# Patient Record
Sex: Male | Born: 1970 | Race: White | Hispanic: No | Smoking: Former smoker
Health system: Southern US, Community
[De-identification: ages and names within clinical notes are randomized; demographics above are authoritative.]

## PROBLEM LIST (undated history)

## (undated) DIAGNOSIS — H35039 Hypertensive retinopathy, unspecified eye: Secondary | ICD-10-CM

## (undated) DIAGNOSIS — F419 Anxiety disorder, unspecified: Secondary | ICD-10-CM

## (undated) DIAGNOSIS — K219 Gastro-esophageal reflux disease without esophagitis: Secondary | ICD-10-CM

## (undated) DIAGNOSIS — M1990 Unspecified osteoarthritis, unspecified site: Secondary | ICD-10-CM

## (undated) DIAGNOSIS — H269 Unspecified cataract: Secondary | ICD-10-CM

## (undated) DIAGNOSIS — F909 Attention-deficit hyperactivity disorder, unspecified type: Secondary | ICD-10-CM

## (undated) DIAGNOSIS — J45909 Unspecified asthma, uncomplicated: Secondary | ICD-10-CM

## (undated) HISTORY — DX: Unspecified cataract: H26.9

## (undated) HISTORY — DX: Attention-deficit hyperactivity disorder, unspecified type: F90.9

## (undated) HISTORY — PX: TOOTH EXTRACTION: SUR596

## (undated) HISTORY — DX: Gastro-esophageal reflux disease without esophagitis: K21.9

## (undated) HISTORY — DX: Hypertensive retinopathy, unspecified eye: H35.039

## (undated) HISTORY — DX: Unspecified asthma, uncomplicated: J45.909

---

## 2009-02-16 ENCOUNTER — Encounter: Payer: Self-pay | Admitting: Cardiology

## 2009-08-03 ENCOUNTER — Encounter: Payer: Self-pay | Admitting: Cardiology

## 2009-08-10 ENCOUNTER — Encounter: Payer: Self-pay | Admitting: Cardiology

## 2009-08-14 ENCOUNTER — Emergency Department (HOSPITAL_COMMUNITY): Admission: EM | Admit: 2009-08-14 | Discharge: 2009-08-14 | Payer: Self-pay | Admitting: Emergency Medicine

## 2009-08-21 ENCOUNTER — Encounter: Payer: Self-pay | Admitting: Cardiology

## 2009-08-22 ENCOUNTER — Ambulatory Visit: Payer: Self-pay | Admitting: Cardiology

## 2009-08-22 DIAGNOSIS — E785 Hyperlipidemia, unspecified: Secondary | ICD-10-CM | POA: Insufficient documentation

## 2009-08-22 DIAGNOSIS — R079 Chest pain, unspecified: Secondary | ICD-10-CM | POA: Insufficient documentation

## 2009-08-24 ENCOUNTER — Ambulatory Visit: Payer: Self-pay | Admitting: Cardiology

## 2009-08-24 ENCOUNTER — Ambulatory Visit: Payer: Self-pay

## 2009-09-04 ENCOUNTER — Emergency Department (HOSPITAL_COMMUNITY): Admission: EM | Admit: 2009-09-04 | Discharge: 2009-09-04 | Payer: Self-pay | Admitting: Family Medicine

## 2010-07-23 NOTE — Consult Note (Signed)
Summary: [facility]   Imported By: Marylou Mccoy 09/07/2009 16:13:29  _____________________________________________________________________  External Attachment:    Type:   Image     Comment:   External Document

## 2010-07-23 NOTE — Assessment & Plan Note (Signed)
Summary: np6/pt has fam hx of MI and cabg/lg   Primary Provider:  Tomi Bamberger  CC:  new patient fam hx for MI and CABG.  Pt had normal ekg on Feb. 11 and 2011.   Marland Kitchen  History of Present Illness: 40 yo with family history of CAD and episodes of shortness of breath and chest pain presents for evaluation.  Patient's father had an MI at 86, which led the patient to want to be checked out.  He has been having episodes of shortness of breath with exertion and at rest sporadically for a couple of years.  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  He has never been formally given the diagnosis of asthma.  The shortness of breath also is associated with chest tightness that lasts 1-2 minutes at a time.  Patient specifically notes that he will get a tight chest and shortness of breath if he tries to climb 2 flights of steps at a fast pace.  He quit smoking in 2009.  He used to have GERD symptoms after meals but these were relieved with omeprazole.  Patient was recently given a prescription for Zocor for elevated lipids but he want to try to avoid medicines and to lower cholesterol with diet and exercise.    ECG: NSR, normal  Labs (2/11): LDL 140,. TGs 176, HDL 29, small LDL 704 (elevated)  Current Medications (verified): 1)  Omeprazole 20 Mg Cpdr (Omeprazole) .... Once Daily 2)  Fish Oil 1000 Mg Caps (Omega-3 Fatty Acids) .... Take One Tablet Once Daily  Allergies (verified): 1)  ! Vicodin  Past History:  Past Medical History: 1. GERD 2. ? asthma  Family History: Reviewed history from 08/21/2009 and no changes required. Father: MI at 59 w/ CABG.  No other coronary disease in the family.   Social History: Works as Psychologist, occupational, lives in [place], quit smoking in 2009, occasional ETOH, no drugs  Review of Systems       All systems reviewed and negative except as per HPI.   Vital Signs:  Patient profile:   40 year old male Height:       69 inches Weight:      199 pounds BMI:     29.49 Pulse rate:   81 / minute Pulse rhythm:   regular BP sitting:   142 / 70  (left arm) Cuff size:   large  Vitals Entered By: Judithe Modest CMA (August 22, 2009 9:48 AM)  Physical Exam  General:  Well developed, well nourished, in no acute distress. Head:  normocephalic and atraumatic Nose:  no deformity, discharge, inflammation, or lesions Mouth:  Teeth, gums and palate normal. Oral mucosa normal. Neck:  Neck supple, no JVD. No masses, thyromegaly or abnormal cervical nodes. Lungs:  Clear bilaterally to auscultation and percussion. Heart:  Non-displaced PMI, chest non-tender; regular rate and rhythm, S1, S2 without murmurs, rubs or gallops. Carotid upstroke normal, no bruit. Pedals normal pulses. No edema, no varicosities. Abdomen:  Bowel sounds positive; abdomen soft and non-tender without masses, organomegaly, or hernias noted. No hepatosplenomegaly. Msk:  Back normal, normal gait. Muscle strength and tone normal. Extremities:  No clubbing or cyanosis. Neurologic:  Alert and oriented x 3. Skin:  Intact without lesions or rashes. Psych:  Normal affect.   Impression & Recommendations:  Problem # 1:  CHEST PAIN UNSPECIFIED (ICD-786.50) Patient has no significant risk factors for CAD other than hyperlipidemia.  Father had  MI but not prematurely. He has chest tightness associated with shortness of breath that occurs both with rest and with exertion.  There sometimes is associated wheezing.  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  I think that these symptoms are most likely due to asthma with symptoms triggered in his workplace.   - Albuterol inhaler, if diagnosis confirmed would use inhaled steroid.   - ETT given family history to assess for ischemia  Problem # 2:  HYPERLIPIDEMIA-MIXED (ICD-272.4) LDL 140 and small LDL elevated.  Patient was offered Zocor by his PCP but wants to try to lower lipids naturally, which would  be reasonable given the patient's low Framingham score.  I suggested that he use over the counter fish oil 2000 mg daily and red yeast rice extract.    Other Orders: Treadmill (Treadmill)  Patient Instructions: 1)  Your physician recommends that you schedule a follow-up appointment in: will see with treadmill 2)  Your physician recommends that you continue on your current medications as directed. Please refer to the Current Medication list given to you today. please continue fish oil--2000mg  once daily--red yeast rice

## 2011-02-28 ENCOUNTER — Inpatient Hospital Stay (INDEPENDENT_AMBULATORY_CARE_PROVIDER_SITE_OTHER)
Admission: RE | Admit: 2011-02-28 | Discharge: 2011-02-28 | Disposition: A | Discharge status: Home / Self Care | Payer: BC Managed Care – PPO | Source: Ambulatory Visit | Attending: Family Medicine | Admitting: Family Medicine

## 2011-02-28 DIAGNOSIS — J40 Bronchitis, not specified as acute or chronic: Secondary | ICD-10-CM

## 2011-02-28 DIAGNOSIS — J019 Acute sinusitis, unspecified: Secondary | ICD-10-CM

## 2011-02-28 DIAGNOSIS — F449 Dissociative and conversion disorder, unspecified: Secondary | ICD-10-CM

## 2011-05-23 ENCOUNTER — Ambulatory Visit
Admission: RE | Admit: 2011-05-23 | Discharge: 2011-05-23 | Disposition: A | Discharge status: Home / Self Care | Payer: BC Managed Care – PPO | Source: Ambulatory Visit | Attending: Medical | Admitting: Medical

## 2011-05-23 ENCOUNTER — Other Ambulatory Visit: Payer: Self-pay | Admitting: Medical

## 2011-09-12 ENCOUNTER — Other Ambulatory Visit: Payer: Self-pay | Admitting: Internal Medicine

## 2011-09-12 DIAGNOSIS — R221 Localized swelling, mass and lump, neck: Secondary | ICD-10-CM

## 2011-09-19 ENCOUNTER — Ambulatory Visit
Admission: RE | Admit: 2011-09-19 | Discharge: 2011-09-19 | Disposition: A | Discharge status: Home / Self Care | Payer: BC Managed Care – PPO | Source: Ambulatory Visit | Attending: Internal Medicine | Admitting: Internal Medicine

## 2011-09-19 DIAGNOSIS — R221 Localized swelling, mass and lump, neck: Secondary | ICD-10-CM

## 2012-01-30 ENCOUNTER — Encounter: Payer: Self-pay | Admitting: Cardiology

## 2014-05-22 ENCOUNTER — Other Ambulatory Visit: Payer: Self-pay | Admitting: Internal Medicine

## 2014-05-22 DIAGNOSIS — R1084 Generalized abdominal pain: Secondary | ICD-10-CM

## 2014-05-26 ENCOUNTER — Ambulatory Visit
Admission: RE | Admit: 2014-05-26 | Discharge: 2014-05-26 | Disposition: A | Discharge status: Home / Self Care | Payer: BC Managed Care – PPO | Source: Ambulatory Visit | Attending: Internal Medicine | Admitting: Internal Medicine

## 2014-05-26 DIAGNOSIS — R1084 Generalized abdominal pain: Secondary | ICD-10-CM

## 2015-10-26 DIAGNOSIS — E785 Hyperlipidemia, unspecified: Secondary | ICD-10-CM | POA: Diagnosis not present

## 2015-11-02 DIAGNOSIS — G43009 Migraine without aura, not intractable, without status migrainosus: Secondary | ICD-10-CM | POA: Diagnosis not present

## 2015-11-02 DIAGNOSIS — K219 Gastro-esophageal reflux disease without esophagitis: Secondary | ICD-10-CM | POA: Diagnosis not present

## 2015-11-02 DIAGNOSIS — J309 Allergic rhinitis, unspecified: Secondary | ICD-10-CM | POA: Diagnosis not present

## 2015-11-02 DIAGNOSIS — E785 Hyperlipidemia, unspecified: Secondary | ICD-10-CM | POA: Diagnosis not present

## 2016-01-30 DIAGNOSIS — L237 Allergic contact dermatitis due to plants, except food: Secondary | ICD-10-CM | POA: Diagnosis not present

## 2016-05-26 ENCOUNTER — Ambulatory Visit (INDEPENDENT_AMBULATORY_CARE_PROVIDER_SITE_OTHER): Payer: BLUE CROSS/BLUE SHIELD | Admitting: Family Medicine

## 2016-05-26 VITALS — BP 140/90 | HR 88 | Temp 97.4°F | Resp 17 | Ht 69.0 in | Wt 207.0 lb

## 2016-05-26 DIAGNOSIS — R0981 Nasal congestion: Secondary | ICD-10-CM | POA: Diagnosis not present

## 2016-05-26 MED ORDER — AZITHROMYCIN 250 MG PO TABS: ORAL_TABLET | ORAL | 0 refills | Status: DC

## 2016-05-26 NOTE — Patient Instructions (Addendum)
Start Azithromycin Take 2 tabs x 1 dose, then 1 tab every day for x 4 days.  Take Mucinex 1200 mg twice daily as needed for congestion.   IF you received an x-ray today, you will receive an invoice from [facility]. Please contact [facility] at [phone] with questions or concerns regarding your invoice.   IF you received labwork today, you will receive an invoice from [facility]. Please contact [facility] at [phone] with questions or concerns regarding your invoice.   Our billing staff will not be able to assist you with questions regarding bills from these companies.  You will be contacted with the lab results as soon as they are available. The fastest way to get your results is to activate your My Chart account. Instructions are located on the last page of this paperwork. If you have not heard from us regarding the results in 2 weeks, please contact this office.      Sinusitis, Adult Sinusitis is soreness and inflammation of your sinuses. Sinuses are hollow spaces in the bones around your face. They are located:  Around your eyes.  In the middle of your forehead.  Behind your nose.  In your cheekbones. Your sinuses and nasal passages are lined with a stringy fluid (mucus). Mucus normally drains out of your sinuses. When your nasal tissues get inflamed or swollen, the mucus can get trapped or blocked so air cannot flow through your sinuses. This lets bacteria, viruses, and funguses grow, and that leads to infection. Follow these instructions at home: Medicines  Take, use, or apply over-the-counter and prescription medicines only as told by your doctor. These may include nasal sprays.  If you were prescribed an antibiotic medicine, take it as told by your doctor. Do not stop taking the antibiotic even if you start to feel better. Hydrate and Humidify  Drink enough water to keep your pee (urine) clear or pale yellow.  Use a cool  mist humidifier to keep the humidity level in your home above 50%.  Breathe in steam for 10-15 minutes, 3-4 times a day or as told by your doctor. You can do this in the bathroom while a hot shower is running.  Try not to spend time in cool or dry air. Rest  Rest as much as possible.  Sleep with your head raised (elevated).  Make sure to get enough sleep each night. General instructions  Put a warm, moist washcloth on your face 3-4 times a day or as told by your doctor. This will help with discomfort.  Wash your hands often with soap and water. If there is no soap and water, use hand sanitizer.  Do not smoke. Avoid being around people who are smoking (secondhand smoke).  Keep all follow-up visits as told by your doctor. This is important. Contact a doctor if:  You have a fever.  Your symptoms get worse.  Your symptoms do not get better within 10 days. Get help right away if:  You have a very bad headache.  You cannot stop throwing up (vomiting).  You have pain or swelling around your face or eyes.  You have trouble seeing.  You feel confused.  Your neck is stiff.  You have trouble breathing. This information is not intended to replace advice given to you by your health care provider. Make sure you discuss any questions you have with your health care provider. Document Released: 11/26/2007 Document Revised: 02/03/2016 Document Reviewed: 04/04/2015 Elsevier Interactive Patient Education  2017  [employer].

## 2016-05-26 NOTE — Progress Notes (Signed)
Patient ID: Tristan MacleodKenneth R Thomas, male    DOB: Mar 22, 1971, 45 y.o.   MRN: [id number]  PCP: No primary care provider on file.  Chief Complaint  Patient presents with  . Sinus Problem    Since saturday     Subjective:   HPI 45 year old male presents for sinus congestion, with facial pain x 4 days.  [facility]. He reports headache, face pain, nasal drainage, congestion, with facial pain on the right side. Throat scratchiness. Bridge of nose feels very tight. He has taken nasal decongestant without relief of symptoms. Reports nonproductive cough started today. Denies fever, nausea, or vomiting.  Social History   Social History  . Marital status: Married    Spouse name: N/A  . Number of children: N/A  . Years of education: N/A   Occupational History  . Not on file.   Social History Main Topics  . Smoking status: Former Games developermoker  . Smokeless tobacco: Not on file  . Alcohol use Yes  . Drug use: No  . Sexual activity: Not on file   Other Topics Concern  . Not on file   Social History Narrative  . No narrative on file    No family history on file.  Review of Systems See HPI    Patient Active Problem List   Diagnosis Date Noted  . HYPERLIPIDEMIA-MIXED 08/22/2009  . CHEST PAIN UNSPECIFIED 08/22/2009     Prior to Admission medications   Medication Sig Start Date End Date Taking? Authorizing Provider  Albuterol Sulfate (VENTOLIN HFA IN) Inhale into the lungs.   Yes Historical Provider, MD  Omega-3 Fatty Acids (FISH OIL PO) Take by mouth.   Yes Historical Provider, MD  omeprazole (PRILOSEC) 20 MG capsule Take 20 mg by mouth daily.   Yes Historical Provider, MD  SUMAtriptan (IMITREX) 100 MG tablet Take 100 mg by mouth every 2 (two) hours as needed for migraine. May repeat in 2 hours if headache persists or recurs.   Yes Historical Provider, MD     Allergies  Allergen Reactions  . Hydrocodone-Acetaminophen        Objective:  Physical Exam    Constitutional: He is oriented to person, place, and time. He appears well-developed and well-nourished.  HENT:  Head: Normocephalic and atraumatic.  Nose: Mucosal edema and rhinorrhea present. Right sinus exhibits frontal sinus tenderness. Left sinus exhibits frontal sinus tenderness.  Mouth/Throat: Uvula is midline. Posterior oropharyngeal erythema present. No oropharyngeal exudate or posterior oropharyngeal edema.  Eyes: Conjunctivae are normal. Pupils are equal, round, and reactive to light.  Neck: Normal range of motion. Neck supple.  Cardiovascular: Normal rate, regular rhythm, normal heart sounds and intact distal pulses.   Pulmonary/Chest: Effort normal and breath sounds normal.  Musculoskeletal: Normal range of motion.  Neurological: He is alert and oriented to person, place, and time.  Skin: Skin is warm and dry.  Psychiatric: He has a normal mood and affect. His behavior is normal. Judgment and thought content normal.    Vitals:   05/26/16 1548  BP: 140/90  Pulse: 88  Resp: 17  Temp: 97.4 F (36.3 C)     Assessment & Plan:  1. Sinus congestion Plan: - Azithromycin Take 2 tabs x 1 dose, then 1 tab every day for x 4 days.  -Mucinex 1200 mg twice daily as needed for congestion.  Return for follow-up if symptoms worsen or do not improve.  Tristan PickKimberly S. Tiburcio PeaHarris, MSN, FNP-C [facility]

## 2016-05-30 DIAGNOSIS — Z0001 Encounter for general adult medical examination with abnormal findings: Secondary | ICD-10-CM | POA: Diagnosis not present

## 2016-06-13 DIAGNOSIS — E785 Hyperlipidemia, unspecified: Secondary | ICD-10-CM | POA: Diagnosis not present

## 2016-06-13 DIAGNOSIS — Z23 Encounter for immunization: Secondary | ICD-10-CM | POA: Diagnosis not present

## 2016-12-05 DIAGNOSIS — Z125 Encounter for screening for malignant neoplasm of prostate: Secondary | ICD-10-CM | POA: Diagnosis not present

## 2016-12-05 DIAGNOSIS — E785 Hyperlipidemia, unspecified: Secondary | ICD-10-CM | POA: Diagnosis not present

## 2016-12-05 DIAGNOSIS — Z0000 Encounter for general adult medical examination without abnormal findings: Secondary | ICD-10-CM | POA: Diagnosis not present

## 2016-12-19 DIAGNOSIS — N410 Acute prostatitis: Secondary | ICD-10-CM | POA: Diagnosis not present

## 2016-12-19 DIAGNOSIS — K219 Gastro-esophageal reflux disease without esophagitis: Secondary | ICD-10-CM | POA: Diagnosis not present

## 2016-12-19 DIAGNOSIS — E7800 Pure hypercholesterolemia, unspecified: Secondary | ICD-10-CM | POA: Diagnosis not present

## 2017-02-10 ENCOUNTER — Ambulatory Visit (INDEPENDENT_AMBULATORY_CARE_PROVIDER_SITE_OTHER): Payer: BLUE CROSS/BLUE SHIELD | Admitting: Physician Assistant

## 2017-02-10 ENCOUNTER — Ambulatory Visit (INDEPENDENT_AMBULATORY_CARE_PROVIDER_SITE_OTHER): Payer: BLUE CROSS/BLUE SHIELD

## 2017-02-10 ENCOUNTER — Encounter: Payer: Self-pay | Admitting: Physician Assistant

## 2017-02-10 VITALS — BP 144/105 | HR 75 | Temp 98.0°F | Resp 18 | Ht 69.45 in | Wt 194.8 lb

## 2017-02-10 DIAGNOSIS — R05 Cough: Secondary | ICD-10-CM | POA: Diagnosis not present

## 2017-02-10 DIAGNOSIS — J22 Unspecified acute lower respiratory infection: Secondary | ICD-10-CM

## 2017-02-10 DIAGNOSIS — R0689 Other abnormalities of breathing: Secondary | ICD-10-CM

## 2017-02-10 DIAGNOSIS — R5383 Other fatigue: Secondary | ICD-10-CM

## 2017-02-10 DIAGNOSIS — J988 Other specified respiratory disorders: Secondary | ICD-10-CM | POA: Diagnosis not present

## 2017-02-10 DIAGNOSIS — R058 Other specified cough: Secondary | ICD-10-CM

## 2017-02-10 MED ORDER — AZITHROMYCIN 250 MG PO TABS: ORAL_TABLET | ORAL | 0 refills | Status: DC

## 2017-02-10 MED ORDER — BENZONATATE 100 MG PO CAPS: 100.0000 mg | ORAL_CAPSULE | Freq: Three times a day (TID) | ORAL | 0 refills | Status: DC | PRN

## 2017-02-10 MED ORDER — PREDNISONE 20 MG PO TABS
ORAL_TABLET | ORAL | 0 refills | Status: DC
Start: 2017-02-10 — End: 2017-04-14

## 2017-02-10 MED ORDER — PROMETHAZINE-DM 6.25-15 MG/5ML PO SYRP: 5.0000 mL | ORAL_SOLUTION | Freq: Four times a day (QID) | ORAL | 0 refills | Status: DC | PRN

## 2017-02-10 NOTE — Progress Notes (Signed)
PRIMARY CARE AT [facility] [address] [place] [address] [phone]  Date:  02/10/2017   Name:  Tristan Thomas   DOB:  05-05-1971   MRN:  [id number]  PCP:  No primary care provider on file.    History of Present Illness:  Tristan Thomas is a 46 y.o. male patient who presents to PCP with  Chief Complaint  Patient presents with  . Cough    X 3 weeks- pt state there is a little blood at times      Nasal drainage initially and was coughing.  This had slowed down but he continues to have the cough.  The cough has had some blood.  He has no nasal bleeding.  Blowing nose is generally white.  He has shortness of breath and added fatigue.  He has taken delsym cough syrup, mucinex.  Allergies--takes flonase.   He has no allergy medication.    Patient Active Problem List   Diagnosis Date Noted  . HYPERLIPIDEMIA-MIXED 08/22/2009  . CHEST PAIN UNSPECIFIED 08/22/2009    Past Medical History:  Diagnosis Date  . Asthma   . GERD (gastroesophageal reflux disease)     History reviewed. No pertinent surgical history.  Social History  Substance Use Topics  . Smoking status: Former Games developer  . Smokeless tobacco: Never Used  . Alcohol use Yes    Family History  Problem Relation Age of Onset  . Heart disease Father   . Stroke Mother   . Hypertension Mother   . Hyperlipidemia Mother     Allergies  Allergen Reactions  . Hydrocodone-Acetaminophen     Medication list has been reviewed and updated.  Current Outpatient Prescriptions on File Prior to Visit  Medication Sig Dispense Refill  . Omega-3 Fatty Acids (FISH OIL PO) Take by mouth.    Marland Kitchen omeprazole (PRILOSEC) 20 MG capsule Take 20 mg by mouth daily.    . SUMAtriptan (IMITREX) 100 MG tablet Take 100 mg by mouth every 2 (two) hours as needed for migraine. May repeat in 2 hours if headache persists or recurs.    . Albuterol Sulfate (VENTOLIN HFA IN) Inhale into the lungs.     No current facility-administered medications on  file prior to visit.     ROS ROS otherwise unremarkable unless listed above.  Physical Examination: BP (!) 144/105 (BP Location: Right Arm, Patient Position: Sitting, Cuff Size: Large)   Pulse 75   Temp 98 F (36.7 C) (Oral)   Resp 18   Ht 5' 9.45" (1.764 m)   Wt 194 lb 12.8 oz (88.4 kg)   SpO2 98%   BMI 28.40 kg/m  Ideal Body Weight: Weight in (lb) to have BMI = 25: 171.1  Physical Exam  Constitutional: He is oriented to person, place, and time. He appears well-developed and well-nourished. No distress.  HENT:  Head: Atraumatic.  Right Ear: Tympanic membrane, external ear and ear canal normal.  Left Ear: Tympanic membrane, external ear and ear canal normal.  Nose: Mucosal edema and rhinorrhea present. Right sinus exhibits no maxillary sinus tenderness and no frontal sinus tenderness. Left sinus exhibits no maxillary sinus tenderness and no frontal sinus tenderness.  Mouth/Throat: No uvula swelling. No oropharyngeal exudate, posterior oropharyngeal edema or posterior oropharyngeal erythema.  Eyes: Pupils are equal, round, and reactive to light. Conjunctivae, EOM and lids are normal. Right eye exhibits normal extraocular motion. Left eye exhibits normal extraocular motion.  Neck: Trachea normal and full passive range of motion without pain. No  edema and no erythema present.  Cardiovascular: Normal rate.   Pulmonary/Chest: Effort normal. No respiratory distress. He has no decreased breath sounds. He has no wheezes. He has no rhonchi.  Neurological: He is alert and oriented to person, place, and time.  Skin: Skin is warm and dry. He is not diaphoretic.  Psychiatric: He has a normal mood and affect. His behavior is normal.   Dg Chest 2 View  Result Date: 02/10/2017 CLINICAL DATA:  Productive cough with hemoptysis. EXAM: CHEST  2 VIEW COMPARISON:  08/14/2009 FINDINGS: The lungs are clear without focal pneumonia, edema, pneumothorax or pleural effusion. The cardiopericardial silhouette  is within normal limits for size. The visualized bony structures of the thorax are intact. IMPRESSION: Stable.  No acute cardiopulmonary findings. Electronically Signed   By: Kennith Center M.D.   On: 02/10/2017 12:22     Assessment and Plan: Tristan Thomas is a 47 y.o. male who is here today for cc of  Chief Complaint  Patient presents with  . Cough    X 3 weeks- pt state there is a little blood at times    Lower respiratory infection (e.g., bronchitis, pneumonia, pneumonitis, pulmonitis) - Plan: azithromycin (ZITHROMAX) 250 MG tablet, predniSONE (DELTASONE) 20 MG tablet, promethazine-dextromethorphan (PROMETHAZINE-DM) 6.25-15 MG/5ML syrup, benzonatate (TESSALON) 100 MG capsule  Productive cough - Plan: DG Chest 2 View, azithromycin (ZITHROMAX) 250 MG tablet, promethazine-dextromethorphan (PROMETHAZINE-DM) 6.25-15 MG/5ML syrup, benzonatate (TESSALON) 100 MG capsule  Other fatigue - Plan: DG Chest 2 View, azithromycin (ZITHROMAX) 250 MG tablet  Winded - Plan: DG Chest 2 View, azithromycin (ZITHROMAX) 250 MG tablet  Respiratory infection - Plan: azithromycin (ZITHROMAX) 250 MG tablet, predniSONE (DELTASONE) 20 MG tablet, promethazine-dextromethorphan (PROMETHAZINE-DM) 6.25-15 MG/5ML syrup, benzonatate (TESSALON) 100 MG capsule  Trena Platt, PA-C [facility] 9/1/20188:36 AM

## 2017-02-10 NOTE — Patient Instructions (Addendum)
Please make sure you are hydrating well with water You want to make sure that your allergies are well controlled.  Please take the medication as prescribed.      IF you received an x-ray today, you will receive an invoice from [facility]. Please contact [facility] at [phone] with questions or concerns regarding your invoice.   IF you received labwork today, you will receive an invoice from [facility]. Please contact [facility] at [phone] with questions or concerns regarding your invoice.   Our billing staff will not be able to assist you with questions regarding bills from these companies.  You will be contacted with the lab results as soon as they are available. The fastest way to get your results is to activate your My Chart account. Instructions are located on the last page of this paperwork. If you have not heard from [place] regarding the results in 2 weeks, please contact this office.

## 2017-02-16 DIAGNOSIS — R102 Pelvic and perineal pain: Secondary | ICD-10-CM | POA: Diagnosis not present

## 2017-04-07 ENCOUNTER — Encounter (HOSPITAL_COMMUNITY): Payer: Self-pay | Admitting: *Deleted

## 2017-04-07 ENCOUNTER — Emergency Department (HOSPITAL_COMMUNITY): Payer: BLUE CROSS/BLUE SHIELD

## 2017-04-07 ENCOUNTER — Emergency Department (HOSPITAL_COMMUNITY)
Admission: EM | Admit: 2017-04-07 | Discharge: 2017-04-08 | Disposition: A | Discharge status: Home / Self Care | Payer: BLUE CROSS/BLUE SHIELD | Attending: Emergency Medicine | Admitting: Emergency Medicine

## 2017-04-07 DIAGNOSIS — S82841A Displaced bimalleolar fracture of right lower leg, initial encounter for closed fracture: Secondary | ICD-10-CM | POA: Diagnosis not present

## 2017-04-07 DIAGNOSIS — J45909 Unspecified asthma, uncomplicated: Secondary | ICD-10-CM | POA: Diagnosis not present

## 2017-04-07 DIAGNOSIS — X509XXA Other and unspecified overexertion or strenuous movements or postures, initial encounter: Secondary | ICD-10-CM | POA: Insufficient documentation

## 2017-04-07 DIAGNOSIS — Y999 Unspecified external cause status: Secondary | ICD-10-CM | POA: Insufficient documentation

## 2017-04-07 DIAGNOSIS — Z87891 Personal history of nicotine dependence: Secondary | ICD-10-CM | POA: Insufficient documentation

## 2017-04-07 DIAGNOSIS — S99911A Unspecified injury of right ankle, initial encounter: Secondary | ICD-10-CM | POA: Diagnosis present

## 2017-04-07 DIAGNOSIS — Y9301 Activity, walking, marching and hiking: Secondary | ICD-10-CM | POA: Insufficient documentation

## 2017-04-07 DIAGNOSIS — Z79899 Other long term (current) drug therapy: Secondary | ICD-10-CM | POA: Insufficient documentation

## 2017-04-07 DIAGNOSIS — Y929 Unspecified place or not applicable: Secondary | ICD-10-CM | POA: Diagnosis not present

## 2017-04-07 MED ORDER — OXYCODONE-ACETAMINOPHEN 5-325 MG PO TABS
1.0000 | ORAL_TABLET | Freq: Once | ORAL | Status: AC
  Administered 2017-04-07: 1 via ORAL

## 2017-04-07 MED ORDER — OXYCODONE-ACETAMINOPHEN 5-325 MG PO TABS
ORAL_TABLET | ORAL | Status: AC
  Filled 2017-04-07: qty 1

## 2017-04-07 MED ORDER — OXYCODONE-ACETAMINOPHEN 5-325 MG PO TABS: 1.0000 | ORAL_TABLET | ORAL | 0 refills | Status: DC | PRN

## 2017-04-07 NOTE — ED Provider Notes (Signed)
[facility] EMERGENCY DEPARTMENT Provider Note   CSN: [id number] Arrival date & time: 04/07/17  2055     History   Chief Complaint Chief Complaint  Patient presents with  . Ankle Pain    HPI Tristan Thomas is a 46 y.o. male.  Patient presents to the emergency department for evaluation of right ankle injury. Patient reports that he was walking and slipped on Monday ground, slipped and twisted his ankle. He reports that he heard a pop and has been experiencing constant severe pain in the ankle since. He cannot bear weight because of the pain.He did not injure himself anywhere else.      Past Medical History:  Diagnosis Date  . Asthma   . GERD (gastroesophageal reflux disease)     Patient Active Problem List   Diagnosis Date Noted  . HYPERLIPIDEMIA-MIXED 08/22/2009  . CHEST PAIN UNSPECIFIED 08/22/2009    History reviewed. No pertinent surgical history.     Home Medications    Prior to Admission medications   Medication Sig Start Date End Date Taking? Authorizing Provider  Albuterol Sulfate (VENTOLIN HFA IN) Inhale into the lungs.    [provider]  azithromycin (ZITHROMAX) 250 MG tablet Take 2 tabs PO x 1 dose, then 1 tab PO QD x 4 days 02/10/17   English, Stephanie D, PA  benzonatate (TESSALON) 100 MG capsule Take 1-2 capsules (100-200 mg total) by mouth 3 (three) times daily as needed for cough. 02/10/17   Trena Platt D, PA  Omega-3 Fatty Acids (FISH OIL PO) Take by mouth.    [provider]  omeprazole (PRILOSEC) 20 MG capsule Take 20 mg by mouth daily.    [provider]  oxyCODONE-acetaminophen (PERCOCET) 5-325 MG tablet Take 1-2 tablets by mouth every 4 (four) hours as needed. 04/07/17   Gilda Crease, MD  predniSONE (DELTASONE) 20 MG tablet Take 3 PO QAM x2days, 2 PO QAM x2days, 1 PO QAM x3days 02/10/17   Trena Platt D, PA  promethazine-dextromethorphan (PROMETHAZINE-DM) 6.25-15 MG/5ML syrup  Take 5 mLs by mouth 4 (four) times daily as needed for cough. 02/10/17   Trena Platt D, PA  SUMAtriptan (IMITREX) 100 MG tablet Take 100 mg by mouth every 2 (two) hours as needed for migraine. May repeat in 2 hours if headache persists or recurs.    [provider]    Family History Family History  Problem Relation Age of Onset  . Heart disease Father   . Stroke Mother   . Hypertension Mother   . Hyperlipidemia Mother     Social History Social History  Substance Use Topics  . Smoking status: Former Games developer  . Smokeless tobacco: Never Used  . Alcohol use Yes     Allergies   Hydrocodone-acetaminophen   Review of Systems Review of Systems  Musculoskeletal: Positive for arthralgias.  All other systems reviewed and are negative.    Physical Exam Updated Vital Signs BP 135/88 (BP Location: Left Arm)   Pulse 88   Temp 98.7 F (37.1 C) (Oral)   Resp 16   SpO2 98%   Physical Exam  Constitutional: He is oriented to person, place, and time. He appears well-developed and well-nourished.  HENT:  Head: Atraumatic.  Eyes: EOM are normal.  Pulmonary/Chest: Effort normal.  Musculoskeletal:       Right ankle: He exhibits decreased range of motion and swelling. Tenderness. Lateral malleolus and medial malleolus tenderness found.  Neurological: He is alert and oriented to person,  place, and time. He has normal strength. No cranial nerve deficit or sensory deficit.  Skin: Skin is intact.     ED Treatments / Results  Labs (all labs ordered are listed, but only abnormal results are displayed) Labs Reviewed - No data to display  EKG  EKG Interpretation None       Radiology Dg Ankle Complete Right  Result Date: 04/07/2017 CLINICAL DATA:  Pain following fall EXAM: RIGHT ANKLE - COMPLETE 3+ VIEW COMPARISON:  None. FINDINGS: Frontal, oblique, and lateral views were obtained. There is generalized soft tissue swelling. There is a comminuted fracture of the  distal fibular diaphysis with lateral displacement of the distal major fracture fragment with respect to the major proximal fragment. There is an incomplete fracture of the superior aspect of the medial malleolus. More distally, there is an avulsion from the medial malleolus. There is a joint effusion. No other fractures are evident. Ankle mortise appears grossly intact. There is a spur arising from the inferior calcaneus. There is a smaller spur arising from the posterior calcaneus. IMPRESSION: 1. Displaced fracture distal fibular diaphysis at a level approximately 6 cm proximal to the tibial plafond. 2. Incomplete fracture along the medial superior aspect of the medial malleolus. There is a small avulsion as well arising from the inferior aspect of the medial malleolus. 3.  Diffuse soft tissue swelling as well as joint effusion. 4.  Ankle mortise grossly intact. Electronically Signed   By: Bretta Bang III M.D.   On: 04/07/2017 21:42    Procedures Procedures (including critical care time)  Medications Ordered in ED Medications  oxyCODONE-acetaminophen (PERCOCET/ROXICET) 5-325 MG per tablet 1 tablet (1 tablet Oral Given 04/07/17 2109)     Initial Impression / Assessment and Plan / ED Course  I have reviewed the triage vital signs and the nursing notes.  Pertinent labs & imaging results that were available during my care of the patient were reviewed by me and considered in my medical decision making (see chart for details).     Patient presents with isolated ankle injury after a fall. Patient has a large amount of swelling of the lateral malleolus with tenderness of both malleoli. X-ray shows distal fibula fracture with likely incomplete fracture of the medial malleolus as well. This is a closed fracture. Patient is neurovascularly intact. He will be laced in a splint, nonweightbearing. He has crutches at home. Will follow-up with orthopedics.  Final Clinical Impressions(s) / ED Diagnoses     Final diagnoses:  Closed bimalleolar fracture of right ankle, initial encounter    New Prescriptions New Prescriptions   OXYCODONE-ACETAMINOPHEN (PERCOCET) 5-325 MG TABLET    Take 1-2 tablets by mouth every 4 (four) hours as needed.     Gilda Crease, MD 04/07/17 2351

## 2017-04-07 NOTE — ED Triage Notes (Signed)
Pt slipped on wet muddy ground and foot slid into brick step.  Pt reports that his ankle snapped.  Pt has obvious deformity to his right ankle.  Pt arrives with ice in place.

## 2017-04-08 MED ORDER — OXYCODONE-ACETAMINOPHEN 5-325 MG PO TABS
1.0000 | ORAL_TABLET | Freq: Once | ORAL | Status: AC
  Administered 2017-04-08: 1 via ORAL
  Filled 2017-04-08: qty 1

## 2017-04-08 NOTE — Progress Notes (Signed)
Orthopedic Tech Progress Note Patient Details:  Tristan MacleodKenneth R Thomas [birth year] [id number]  Ortho Devices Type of Ortho Device: Post (short leg) splint, Stirrup splint Ortho Device/Splint Location: rle Ortho Device/Splint Interventions: Ordered, Application, Adjustment   Trinna PostMartinez, [name] 04/08/2017, 12:23 AM

## 2017-04-08 NOTE — ED Notes (Signed)
Pt verbalizes understanding of d/c instructions. Pt received prescriptions. Pt taken out to lobby in wheelchair at d/c with all belongings.   

## 2017-04-13 ENCOUNTER — Ambulatory Visit (INDEPENDENT_AMBULATORY_CARE_PROVIDER_SITE_OTHER): Payer: BLUE CROSS/BLUE SHIELD | Admitting: Orthopedic Surgery

## 2017-04-13 ENCOUNTER — Encounter (INDEPENDENT_AMBULATORY_CARE_PROVIDER_SITE_OTHER): Payer: Self-pay | Admitting: Orthopedic Surgery

## 2017-04-13 VITALS — Ht 70.0 in | Wt 195.0 lb

## 2017-04-13 DIAGNOSIS — S82841A Displaced bimalleolar fracture of right lower leg, initial encounter for closed fracture: Secondary | ICD-10-CM | POA: Diagnosis not present

## 2017-04-13 MED ORDER — OXYCODONE-ACETAMINOPHEN 5-325 MG PO TABS: 1.0000 | ORAL_TABLET | ORAL | 0 refills | Status: AC | PRN

## 2017-04-13 NOTE — Addendum Note (Signed)
Addended by: Barnie DelZAMORA, Sem Mccaughey R on: 04/13/2017 04:04 PM   Modules accepted: Orders

## 2017-04-13 NOTE — Progress Notes (Signed)
Office Visit Note   Patient: Tristan Thomas           Date of Birth: May 13, 1971           MRN: [id number] Visit Date: 04/13/2017              Requested by: No referring provider defined for this encounter. PCP: No primary care provider on file.  Chief Complaint  Patient presents with  . Right Ankle - Fracture      HPI: Patient is a 46 year old gentleman who states that on 04/06/2017 he slipped in his flip-flops on wet leaves and had an acute twisting injury to his right ankle and heard an acute pop.  [facility].  He was placed in a sugar tong splint and was referred for evaluation.  Patient states he has been having pain he has been nonweightbearing on crutches.  Assessment & Plan: Visit Diagnoses:  1. Bimalleolar ankle fracture, right, closed, initial encounter     Plan: Recommended proceeding with open reduction internal fixation due to the displacement of the Weber C bimalleolar ankle fracture.  Risks and benefits were discussed including infection neurovascular injury DVT pulmonary embolus need for additional surgery potential for traumatic arthritis.  Patient states she understands wished to proceed at this time.  Discussed the importance of ice and elevation preoperatively.  Follow-Up Instructions: Return in about 1 week (around 04/20/2017).   Ortho Exam  Patient is alert, oriented, no adenopathy, well-dressed, normal affect, normal respiratory effort. Examination patient is ambulating on crutches nonweightbearing on the right.  The splint was removed he has a good dorsalis pedis pulse he does have swelling in the foot ankle and leg.  There is some bruising proximal to the medial malleolus however there is no fracture blisters on either side no skin breakdown no evidence of an open wound.  Review of the radiographs shows a displaced bimalleolar Weber C fracture with disruption of the syndesmosis.  Patient has good capillary  refill and he is grossly neurovascularly intact.  Imaging: No results found. No images are attached to the encounter.  Labs: No results found for: HGBA1C, ESRSEDRATE, CRP, LABURIC, REPTSTATUS, GRAMSTAIN, CULT, LABORGA  Orders:  No orders of the defined types were placed in this encounter.  No orders of the defined types were placed in this encounter.    Procedures: No procedures performed  Clinical Data: No additional findings.  ROS:  All other systems negative, except as noted in the HPI. Review of Systems  Objective: Vital Signs: Ht 5\' 10"  (1.778 m)   Wt 195 lb (88.5 kg)   BMI 27.98 kg/m   Specialty Comments:  No specialty comments available.  PMFS History: Patient Active Problem List   Diagnosis Date Noted  . Bimalleolar ankle fracture, right, closed, initial encounter 04/13/2017  . HYPERLIPIDEMIA-MIXED 08/22/2009  . CHEST PAIN UNSPECIFIED 08/22/2009   Past Medical History:  Diagnosis Date  . Asthma   . GERD (gastroesophageal reflux disease)     Family History  Problem Relation Age of Onset  . Heart disease Father   . Stroke Mother   . Hypertension Mother   . Hyperlipidemia Mother     No past surgical history on file. Social History   Occupational History  . Not on file.   Social History Main Topics  . Smoking status: Former Games developermoker  . Smokeless tobacco: Never Used  . Alcohol use Yes  . Drug use: No  . Sexual activity:  Not on file

## 2017-04-14 ENCOUNTER — Encounter (HOSPITAL_COMMUNITY): Payer: Self-pay | Admitting: *Deleted

## 2017-04-14 ENCOUNTER — Other Ambulatory Visit (INDEPENDENT_AMBULATORY_CARE_PROVIDER_SITE_OTHER): Payer: Self-pay | Admitting: Orthopedic Surgery

## 2017-04-14 DIAGNOSIS — S82841A Displaced bimalleolar fracture of right lower leg, initial encounter for closed fracture: Secondary | ICD-10-CM

## 2017-04-15 ENCOUNTER — Ambulatory Visit (HOSPITAL_COMMUNITY): Payer: BLUE CROSS/BLUE SHIELD | Admitting: Anesthesiology

## 2017-04-15 ENCOUNTER — Encounter (HOSPITAL_COMMUNITY)
Admission: RE | Disposition: A | Discharge status: Home / Self Care | Payer: Self-pay | Source: Ambulatory Visit | Attending: Orthopedic Surgery

## 2017-04-15 ENCOUNTER — Ambulatory Visit (HOSPITAL_COMMUNITY)
Admission: RE | Admit: 2017-04-15 | Discharge: 2017-04-15 | Disposition: A | Discharge status: Home / Self Care | Payer: BLUE CROSS/BLUE SHIELD | Source: Ambulatory Visit | Attending: Orthopedic Surgery | Admitting: Orthopedic Surgery

## 2017-04-15 ENCOUNTER — Encounter (HOSPITAL_COMMUNITY): Payer: Self-pay | Admitting: *Deleted

## 2017-04-15 DIAGNOSIS — K219 Gastro-esophageal reflux disease without esophagitis: Secondary | ICD-10-CM | POA: Insufficient documentation

## 2017-04-15 DIAGNOSIS — S8262XA Displaced fracture of lateral malleolus of left fibula, initial encounter for closed fracture: Secondary | ICD-10-CM | POA: Diagnosis not present

## 2017-04-15 DIAGNOSIS — Z79891 Long term (current) use of opiate analgesic: Secondary | ICD-10-CM | POA: Insufficient documentation

## 2017-04-15 DIAGNOSIS — G8918 Other acute postprocedural pain: Secondary | ICD-10-CM | POA: Diagnosis not present

## 2017-04-15 DIAGNOSIS — Z9889 Other specified postprocedural states: Secondary | ICD-10-CM | POA: Insufficient documentation

## 2017-04-15 DIAGNOSIS — Z87891 Personal history of nicotine dependence: Secondary | ICD-10-CM | POA: Insufficient documentation

## 2017-04-15 DIAGNOSIS — Z885 Allergy status to narcotic agent status: Secondary | ICD-10-CM | POA: Diagnosis not present

## 2017-04-15 DIAGNOSIS — S8251XA Displaced fracture of medial malleolus of right tibia, initial encounter for closed fracture: Secondary | ICD-10-CM | POA: Diagnosis not present

## 2017-04-15 DIAGNOSIS — W010XXA Fall on same level from slipping, tripping and stumbling without subsequent striking against object, initial encounter: Secondary | ICD-10-CM | POA: Diagnosis not present

## 2017-04-15 DIAGNOSIS — Z7951 Long term (current) use of inhaled steroids: Secondary | ICD-10-CM | POA: Insufficient documentation

## 2017-04-15 DIAGNOSIS — E782 Mixed hyperlipidemia: Secondary | ICD-10-CM | POA: Diagnosis not present

## 2017-04-15 DIAGNOSIS — S82841A Displaced bimalleolar fracture of right lower leg, initial encounter for closed fracture: Secondary | ICD-10-CM | POA: Diagnosis not present

## 2017-04-15 DIAGNOSIS — S93431A Sprain of tibiofibular ligament of right ankle, initial encounter: Secondary | ICD-10-CM | POA: Insufficient documentation

## 2017-04-15 DIAGNOSIS — Z79899 Other long term (current) drug therapy: Secondary | ICD-10-CM | POA: Insufficient documentation

## 2017-04-15 HISTORY — DX: Anxiety disorder, unspecified: F41.9

## 2017-04-15 HISTORY — DX: Unspecified osteoarthritis, unspecified site: M19.90

## 2017-04-15 HISTORY — PX: ORIF ANKLE FRACTURE: SHX5408

## 2017-04-15 LAB — COMPREHENSIVE METABOLIC PANEL
ALK PHOS: 50 U/L (ref 38–126)
ALT: 21 U/L (ref 17–63)
AST: 22 U/L (ref 15–41)
Albumin: 3.9 g/dL (ref 3.5–5.0)
Anion gap: 12 (ref 5–15)
BUN: 10 mg/dL (ref 6–20)
CALCIUM: 9 mg/dL (ref 8.9–10.3)
CO2: 22 mmol/L (ref 22–32)
CREATININE: 0.88 mg/dL (ref 0.61–1.24)
Chloride: 104 mmol/L (ref 101–111)
Glucose, Bld: 100 mg/dL — ABNORMAL HIGH (ref 65–99)
Potassium: 4.1 mmol/L (ref 3.5–5.1)
Sodium: 138 mmol/L (ref 135–145)
TOTAL PROTEIN: 6.6 g/dL (ref 6.5–8.1)
Total Bilirubin: 0.8 mg/dL (ref 0.3–1.2)

## 2017-04-15 LAB — CBC
HCT: 40.9 % (ref 39.0–52.0)
HEMOGLOBIN: 13.9 g/dL (ref 13.0–17.0)
MCH: 30.5 pg (ref 26.0–34.0)
MCHC: 34 g/dL (ref 30.0–36.0)
MCV: 89.7 fL (ref 78.0–100.0)
Platelets: 304 10*3/uL (ref 150–400)
RBC: 4.56 MIL/uL (ref 4.22–5.81)
RDW: 12.4 % (ref 11.5–15.5)
WBC: 7.2 10*3/uL (ref 4.0–10.5)

## 2017-04-15 LAB — APTT: aPTT: 30 seconds (ref 24–36)

## 2017-04-15 LAB — PROTIME-INR
INR: 0.96
PROTHROMBIN TIME: 12.7 s (ref 11.4–15.2)

## 2017-04-15 SURGERY — OPEN REDUCTION INTERNAL FIXATION (ORIF) ANKLE FRACTURE
Anesthesia: General | Site: Ankle | Laterality: Right

## 2017-04-15 MED ORDER — CEFAZOLIN SODIUM-DEXTROSE 2-4 GM/100ML-% IV SOLN
2.0000 g | INTRAVENOUS | Status: AC
  Administered 2017-04-15: 2 g via INTRAVENOUS
  Filled 2017-04-15: qty 100

## 2017-04-15 MED ORDER — DEXAMETHASONE SODIUM PHOSPHATE 10 MG/ML IJ SOLN
INTRAMUSCULAR | Status: AC
  Filled 2017-04-15: qty 1

## 2017-04-15 MED ORDER — OXYCODONE HCL 5 MG/5ML PO SOLN: 5.0000 mg | Freq: Once | ORAL | Status: DC | PRN

## 2017-04-15 MED ORDER — LACTATED RINGERS IV SOLN
INTRAVENOUS | Status: DC
  Administered 2017-04-15 (×2): via INTRAVENOUS

## 2017-04-15 MED ORDER — PROPOFOL 10 MG/ML IV BOLUS
INTRAVENOUS | Status: DC | PRN
  Administered 2017-04-15: 200 mg via INTRAVENOUS

## 2017-04-15 MED ORDER — ONDANSETRON HCL 4 MG/2ML IJ SOLN
INTRAMUSCULAR | Status: DC | PRN
  Administered 2017-04-15: 4 mg via INTRAVENOUS

## 2017-04-15 MED ORDER — PROPOFOL 10 MG/ML IV BOLUS
INTRAVENOUS | Status: AC
  Filled 2017-04-15: qty 20

## 2017-04-15 MED ORDER — OXYCODONE HCL 5 MG PO TABS: 5.0000 mg | ORAL_TABLET | Freq: Once | ORAL | Status: DC | PRN

## 2017-04-15 MED ORDER — 0.9 % SODIUM CHLORIDE (POUR BTL) OPTIME
TOPICAL | Status: DC | PRN
  Administered 2017-04-15: 1000 mL

## 2017-04-15 MED ORDER — FENTANYL CITRATE (PF) 100 MCG/2ML IJ SOLN
100.0000 ug | Freq: Once | INTRAMUSCULAR | Status: AC
  Administered 2017-04-15: 100 ug via INTRAVENOUS
  Filled 2017-04-15: qty 2

## 2017-04-15 MED ORDER — LIDOCAINE HCL (CARDIAC) 20 MG/ML IV SOLN
INTRAVENOUS | Status: DC | PRN
  Administered 2017-04-15: 50 mg via INTRAVENOUS

## 2017-04-15 MED ORDER — FENTANYL CITRATE (PF) 100 MCG/2ML IJ SOLN
INTRAMUSCULAR | Status: DC | PRN
  Administered 2017-04-15 (×2): 50 ug via INTRAVENOUS

## 2017-04-15 MED ORDER — CHLORHEXIDINE GLUCONATE 4 % EX LIQD: 60.0000 mL | Freq: Once | CUTANEOUS | Status: DC

## 2017-04-15 MED ORDER — OXYCODONE-ACETAMINOPHEN 5-325 MG PO TABS: 1.0000 | ORAL_TABLET | ORAL | 0 refills | Status: AC | PRN

## 2017-04-15 MED ORDER — FENTANYL CITRATE (PF) 250 MCG/5ML IJ SOLN
INTRAMUSCULAR | Status: AC
  Filled 2017-04-15: qty 5

## 2017-04-15 MED ORDER — MIDAZOLAM HCL 2 MG/2ML IJ SOLN
INTRAMUSCULAR | Status: AC
  Filled 2017-04-15: qty 2

## 2017-04-15 MED ORDER — ROPIVACAINE HCL 5 MG/ML IJ SOLN
INTRAMUSCULAR | Status: DC | PRN
  Administered 2017-04-15: 50 mL via PERINEURAL

## 2017-04-15 MED ORDER — MEPERIDINE HCL 25 MG/ML IJ SOLN: 6.2500 mg | INTRAMUSCULAR | Status: DC | PRN

## 2017-04-15 MED ORDER — MIDAZOLAM HCL 2 MG/2ML IJ SOLN
INTRAMUSCULAR | Status: AC
  Administered 2017-04-15: 2 mg via INTRAVENOUS
  Filled 2017-04-15: qty 2

## 2017-04-15 MED ORDER — FENTANYL CITRATE (PF) 100 MCG/2ML IJ SOLN
INTRAMUSCULAR | Status: AC
  Administered 2017-04-15: 100 ug via INTRAVENOUS
  Filled 2017-04-15: qty 2

## 2017-04-15 MED ORDER — MIDAZOLAM HCL 2 MG/2ML IJ SOLN
2.0000 mg | Freq: Once | INTRAMUSCULAR | Status: AC
  Administered 2017-04-15: 2 mg via INTRAVENOUS

## 2017-04-15 MED ORDER — PROMETHAZINE HCL 25 MG/ML IJ SOLN: 6.2500 mg | INTRAMUSCULAR | Status: DC | PRN

## 2017-04-15 MED ORDER — HYDROMORPHONE HCL 1 MG/ML IJ SOLN: 0.2500 mg | INTRAMUSCULAR | Status: DC | PRN

## 2017-04-15 MED ORDER — ONDANSETRON HCL 4 MG/2ML IJ SOLN
INTRAMUSCULAR | Status: AC
  Filled 2017-04-15: qty 2

## 2017-04-15 SURGICAL SUPPLY — 48 items
BANDAGE ESMARK 6X9 LF (GAUZE/BANDAGES/DRESSINGS) IMPLANT
BIT DRILL 2.5X110 QC LCP DISP (BIT) ×2 IMPLANT
BLADE SURG 21 STRL SS (BLADE) ×2 IMPLANT
BNDG COHESIVE 4X5 TAN STRL (GAUZE/BANDAGES/DRESSINGS) ×2 IMPLANT
BNDG ESMARK 6X9 LF (GAUZE/BANDAGES/DRESSINGS)
BNDG GAUZE ELAST 4 BULKY (GAUZE/BANDAGES/DRESSINGS) ×2 IMPLANT
COVER MAYO STAND STRL (DRAPES) ×2 IMPLANT
COVER SURGICAL LIGHT HANDLE (MISCELLANEOUS) ×2 IMPLANT
DRAPE OEC MINIVIEW 54X84 (DRAPES) IMPLANT
DRAPE U-SHAPE 47X51 STRL (DRAPES) ×2 IMPLANT
DRSG ADAPTIC 3X8 NADH LF (GAUZE/BANDAGES/DRESSINGS) ×2 IMPLANT
DRSG PAD ABDOMINAL 8X10 ST (GAUZE/BANDAGES/DRESSINGS) ×2 IMPLANT
DURAPREP 26ML APPLICATOR (WOUND CARE) ×2 IMPLANT
ELECT REM PT RETURN 9FT ADLT (ELECTROSURGICAL) ×2
ELECTRODE REM PT RTRN 9FT ADLT (ELECTROSURGICAL) ×1 IMPLANT
GAUZE SPONGE 4X4 12PLY STRL (GAUZE/BANDAGES/DRESSINGS) ×2 IMPLANT
GLOVE BIOGEL PI IND STRL 9 (GLOVE) ×1 IMPLANT
GLOVE BIOGEL PI INDICATOR 9 (GLOVE) ×1
GLOVE SURG ORTHO 9.0 STRL STRW (GLOVE) ×2 IMPLANT
GOWN STRL REUS W/ TWL XL LVL3 (GOWN DISPOSABLE) ×3 IMPLANT
GOWN STRL REUS W/TWL XL LVL3 (GOWN DISPOSABLE) ×3
GUIDEWARE NON THREAD 1.25X150 (WIRE) ×4
GUIDEWIRE NON THREAD 1.25X150 (WIRE) ×2 IMPLANT
KIT BASIN OR (CUSTOM PROCEDURE TRAY) ×2 IMPLANT
KIT ROOM TURNOVER OR (KITS) ×2 IMPLANT
MANIFOLD NEPTUNE II (INSTRUMENTS) ×2 IMPLANT
NS IRRIG 1000ML POUR BTL (IV SOLUTION) ×2 IMPLANT
PACK ORTHO EXTREMITY (CUSTOM PROCEDURE TRAY) ×2 IMPLANT
PAD ARMBOARD 7.5X6 YLW CONV (MISCELLANEOUS) ×4 IMPLANT
PLATE LCP 3.5 1/3 TUB 10HX117 (Plate) ×2 IMPLANT
SCREW CANN S THRD/44 4.0 (Screw) ×2 IMPLANT
SCREW CORTEX 3.5 12MM (Screw) ×3 IMPLANT
SCREW CORTEX 3.5 14MM (Screw) ×1 IMPLANT
SCREW CORTEX 3.5 20MM (Screw) ×1 IMPLANT
SCREW CORTEX 3.5 50MM (Screw) ×2 IMPLANT
SCREW LOCK CORT ST 3.5X12 (Screw) ×3 IMPLANT
SCREW LOCK CORT ST 3.5X14 (Screw) ×1 IMPLANT
SCREW LOCK CORT ST 3.5X20 (Screw) ×1 IMPLANT
SCREW SHORT THREAD 4.0X42 (Screw) ×2 IMPLANT
STAPLER VISISTAT 35W (STAPLE) IMPLANT
SUCTION FRAZIER HANDLE 10FR (MISCELLANEOUS) ×1
SUCTION TUBE FRAZIER 10FR DISP (MISCELLANEOUS) ×1 IMPLANT
SUT ETHILON 2 0 PSLX (SUTURE) ×2 IMPLANT
SUT VIC AB 2-0 CT1 27 (SUTURE) ×1
SUT VIC AB 2-0 CT1 TAPERPNT 27 (SUTURE) ×1 IMPLANT
TOWEL OR 17X24 6PK STRL BLUE (TOWEL DISPOSABLE) IMPLANT
TOWEL OR 17X26 10 PK STRL BLUE (TOWEL DISPOSABLE) IMPLANT
TUBE CONNECTING 12X1/4 (SUCTIONS) ×2 IMPLANT

## 2017-04-15 NOTE — Anesthesia Postprocedure Evaluation (Signed)
Anesthesia Post Note  Patient: Tristan MacleodKenneth R Thomas  Procedure(s) Performed: OPEN REDUCTION INTERNAL FIXATION (ORIF) RIGHT ANKLE AND SYNDESMOSIS (Right Ankle)     Patient location during evaluation: PACU Anesthesia Type: General Level of consciousness: awake and alert Pain management: pain level controlled Vital Signs Assessment: post-procedure vital signs reviewed and stable Respiratory status: spontaneous breathing, nonlabored ventilation and respiratory function stable Cardiovascular status: blood pressure returned to baseline and stable Postop Assessment: no apparent nausea or vomiting Anesthetic complications: no    Last Vitals:  Vitals:   04/15/17 1145 04/15/17 1155  BP: 121/86 (!) 112/98  Pulse: 96 88  Resp: 14 14  Temp:  36.5 C  SpO2: 96% 97%    Last Pain:  Vitals:   04/15/17 1155  PainSc: 0-No pain                 [name]

## 2017-04-15 NOTE — Anesthesia Procedure Notes (Signed)
Procedure Name: LMA Insertion Date/Time: 04/15/2017 10:03 AM Performed by: Gwenyth AllegraADAMI, Cabrina Shiroma Pre-anesthesia Checklist: Patient identified, Emergency Drugs available, Suction available, Patient being monitored and Timeout performed Patient Re-evaluated:Patient Re-evaluated prior to induction Oxygen Delivery Method: Circle system utilized Preoxygenation: Pre-oxygenation with 100% oxygen Induction Type: IV induction LMA: LMA inserted LMA Size: 4.0 Number of attempts: 1 Placement Confirmation: positive ETCO2 and breath sounds checked- equal and bilateral Tube secured with: Tape Dental Injury: Teeth and Oropharynx as per pre-operative assessment

## 2017-04-15 NOTE — Progress Notes (Signed)
Orthopedic Tech Progress Note Patient Details:  Tristan MacleodKenneth R Thomas 1971-01-19 [id number]  Ortho Devices Type of Ortho Device: CAM walker Ortho Device/Splint Location: rle Ortho Device/Splint Interventions: Application   Tristan Thomas 04/15/2017, 11:35 AM Viewed order from doctor's order list

## 2017-04-15 NOTE — Op Note (Signed)
04/15/2017  10:49 AM  PATIENT:  Tristan MacleodKenneth R Beauchaine    PRE-OPERATIVE DIAGNOSIS:  Weber C Right Ankle Fracture with syndesmotic injury and medial malleolar fracture  POST-OPERATIVE DIAGNOSIS:  Same  PROCEDURE:  OPEN REDUCTION INTERNAL FIXATION (ORIF) RIGHT ANKLE INCLUDING MEDIAL AND LATERAL MALLEOLUS ,AND SYNDESMOSIS reconstruction  SURGEON:  Nadara MustardMarcus V Tangy Drozdowski, MD  PHYSICIAN ASSISTANT:None ANESTHESIA:   General  PREOPERATIVE INDICATIONS:  Tristan MacleodKenneth R Holford is a  46 y.o. male with a diagnosis of Weber C Right Ankle Fracture who failed conservative measures and elected for surgical management.    The risks benefits and alternatives were discussed with the patient preoperatively including but not limited to the risks of infection, bleeding, nerve injury, cardiopulmonary complications, the need for revision surgery, among others, and the patient was willing to proceed.  OPERATIVE IMPLANTS: A 10 hole one third tubular plate, Synthes hardware  OPERATIVE FINDINGS: Segmental fibular Weber C fracture with syndesmosis injury  OPERATIVE PROCEDURE: Patient was brought to the operating room and underwent a general anesthetic.  After adequate levels of anesthesia were obtained patient's right lower extremity was prepped using DuraPrep draped into a sterile field a timeout was called.  A lateral incision was made this was carried sharply down to bone.  Subperiosteal dissection was used to freshen the fracture edges.  Patient had a butterfly segmental fragment posteriorly.  With reduction and internal fixation this did not stay reduced.  A 10 hole plate was then used to secondary  reduce the fracture.  The 10 hole plate was secured to the distal fragment the distal fragment was pulled out to length and secured to the proximal fragment.  This verified alignment with length.  The butterfly fragment was then reduced to the construct and stabilized with a lag screw.  Proximal screws were placed.  The mortise was  aligned and a syndesmotic screw was placed through the distal hole of the hardware to stabilize the syndesmosis.  The ankle mortise was congruent.  Attention was then focused on the medial malleolus.  This was approached through a medial longitudinal incision.  The fracture edges was freshened stabilized with a dental pick and 2 K wires were placed across the fracture site.  C-arm fluoroscopy verified alignment of the medial joint space.  This was then stabilized with a 44 and 42 mm screw.  C-arm fluoroscopy and verified congruence of the mortise.  The wounds were irrigated with normal saline subcu was closed using 2-0 Vicryl the skin was closed with 2-0 nylon.  A sterile dressing was applied patient was extubated and taken the PACU in stable condition.

## 2017-04-15 NOTE — Anesthesia Procedure Notes (Signed)
Anesthesia Regional Block: Popliteal block   Pre-Anesthetic Checklist: ,, timeout performed, Correct Patient, Correct Site, Correct Laterality, Correct Procedure, Correct Position, site marked, Risks and benefits discussed,  Surgical consent,  Pre-op evaluation,  At surgeon's request and post-op pain management  Laterality: Right  Prep: chloraprep       Needles:  Injection technique: Single-shot  Needle Type: Stimiplex     Needle Length: 9cm  Needle Gauge: 21     Additional Needles:   Procedures:,,,, ultrasound used (permanent image in chart),,,,  Narrative:  Start time: 04/15/2017 8:39 AM End time: 04/15/2017 8:44 AM Injection made incrementally with aspirations every 5 mL.  Performed by: Personally  Anesthesiologist: Anitra LauthMILLER, Roe Wilner RAY  Additional Notes: 20 ml of 0.5% Ropivacaine placed in Right Adductor Canal space in addition to popliteal block

## 2017-04-15 NOTE — Anesthesia Preprocedure Evaluation (Signed)
Anesthesia Evaluation  Patient identified by MRN, date of birth, ID band Patient awake    Reviewed: Allergy & Precautions, NPO status , Patient's Chart, lab work & pertinent test results  Airway Mallampati: II  TM Distance: >3 FB Neck ROM: Full    Dental no notable dental hx. (+) Missing   Pulmonary neg pulmonary ROS, asthma , former smoker,    Pulmonary exam normal breath sounds clear to auscultation       Cardiovascular negative cardio ROS Normal cardiovascular exam Rhythm:Regular Rate:Normal     Neuro/Psych Anxiety negative neurological ROS  negative psych ROS   GI/Hepatic negative GI ROS, Neg liver ROS, GERD  ,  Endo/Other  negative endocrine ROS  Renal/GU negative Renal ROS  negative genitourinary   Musculoskeletal negative musculoskeletal ROS (+) Arthritis , Osteoarthritis,    Abdominal   Peds negative pediatric ROS (+)  Hematology negative hematology ROS (+)   Anesthesia Other Findings   Reproductive/Obstetrics negative OB ROS                             Anesthesia Physical Anesthesia Plan  ASA: II  Anesthesia Plan: General   Post-op Pain Management: GA combined w/ Regional for post-op pain   Induction: Intravenous  PONV Risk Score and Plan: 2 and Ondansetron and Midazolam  Airway Management Planned: LMA  Additional Equipment:   Intra-op Plan:   Post-operative Plan: Extubation in OR  Informed Consent: I have reviewed the patients History and Physical, chart, labs and discussed the procedure including the risks, benefits and alternatives for the proposed anesthesia with the patient or authorized representative who has indicated his/her understanding and acceptance.   Dental advisory given  Plan Discussed with: CRNA  Anesthesia Plan Comments:         Anesthesia Quick Evaluation

## 2017-04-15 NOTE — Transfer of Care (Signed)
Immediate Anesthesia Transfer of Care Note  Patient: Tristan MacleodKenneth R Doukas  Procedure(s) Performed: OPEN REDUCTION INTERNAL FIXATION (ORIF) RIGHT ANKLE AND SYNDESMOSIS (Right Ankle)  Patient Location: PACU  Anesthesia Type:GA combined with regional for post-op pain  Level of Consciousness: awake and alert   Airway & Oxygen Therapy: Patient Spontanous Breathing and Patient connected to nasal cannula oxygen  Post-op Assessment: Report given to RN and Post -op Vital signs reviewed and stable  Post vital signs: Reviewed and stable  Last Vitals:  Vitals:   04/15/17 0850 04/15/17 1100  BP: (!) 140/95 (!) 146/72  Pulse: 80 (!) 136  Resp: (!) 8 18  Temp:  (!) 36.1 C  SpO2: 99% 97%    Last Pain:  Vitals:   04/15/17 1100  PainSc: 10-Worst pain ever         Complications: No apparent anesthesia complications

## 2017-04-15 NOTE — H&P (Signed)
Tristan MacleodKenneth R Thomas is an 46 y.o. male.   Chief Complaint: Right ankle pain HPI: Patient is a 46 year old gentleman who states that on 04/06/2017 he slipped in his flip-flops on wet leaves and had an acute twisting injury to his right ankle and heard an acute pop.  [facility]/07/2017.  He was placed in a sugar tong splint and was referred for evaluation.  Patient states he has been having pain he has been nonweightbearing on crutches.  Past Medical History:  Diagnosis Date  . Anxiety   . Arthritis   . Asthma    not diagnosed  . GERD (gastroesophageal reflux disease)     Past Surgical History:  Procedure Laterality Date  . TOOTH EXTRACTION     9 removed    Family History  Problem Relation Age of Onset  . Heart disease Father   . Stroke Mother   . Hypertension Mother   . Hyperlipidemia Mother    Social History:  reports that he has quit smoking. He quit after 15.00 years of use. He has never used smokeless tobacco. He reports that he drinks alcohol. He reports that he does not use drugs.  Allergies:  Allergies  Allergen Reactions  . Hydrocodone-Acetaminophen Other (See Comments)    Pt got cold-chills     No prescriptions prior to admission.    No results found for this or any previous visit (from the past 48 hour(s)). No results found.  Review of Systems  All other systems reviewed and are negative.   There were no vitals taken for this visit. Physical Exam  Examination of skin there are no blisters no skin breakdown there is some bruising proximal to the medial malleolus.  Patient has a good dorsalis pedis pulse.  Radiographs shows a Weber C bimalleolar ankle fracture with widening of the syndesmosis. Assessment/Plan Assessment: Weber C bimalleolar right ankle fracture with widening of the syndesmosis.  Plan: We will plan for open reduction internal fixation of the distal fibula medial malleolus syndesmosis.  Risk and benefits were  discussed including traumatic arthritis DVT infection need for additional surgery.  Patient states he understands and wishes to proceed at this time.  Nadara MustardMarcus V Alyanah Elliott, MD 04/15/2017, 6:25 AM

## 2017-04-16 NOTE — Addendum Note (Signed)
Addendum  created 04/16/17 1111 by Lowella CurbMiller, Ralston Venus Ray, MD   Anesthesia Staff edited

## 2017-04-16 NOTE — Addendum Note (Signed)
Addendum  created 04/16/17 1027 by Lowella CurbMiller, Shaivi Rothschild Ray, MD   Anesthesia Staff edited

## 2017-04-17 ENCOUNTER — Encounter (HOSPITAL_COMMUNITY): Payer: Self-pay | Admitting: Orthopedic Surgery

## 2017-04-22 ENCOUNTER — Ambulatory Visit (INDEPENDENT_AMBULATORY_CARE_PROVIDER_SITE_OTHER): Payer: BLUE CROSS/BLUE SHIELD | Admitting: Orthopedic Surgery

## 2017-04-22 ENCOUNTER — Encounter (INDEPENDENT_AMBULATORY_CARE_PROVIDER_SITE_OTHER): Payer: Self-pay | Admitting: Orthopedic Surgery

## 2017-04-22 ENCOUNTER — Ambulatory Visit (INDEPENDENT_AMBULATORY_CARE_PROVIDER_SITE_OTHER): Payer: BLUE CROSS/BLUE SHIELD

## 2017-04-22 DIAGNOSIS — S82841A Displaced bimalleolar fracture of right lower leg, initial encounter for closed fracture: Secondary | ICD-10-CM

## 2017-04-22 NOTE — Progress Notes (Signed)
Office Visit Note   Patient: Tristan Thomas           Date of Birth: 1970-12-15           MRN: [id number] Visit Date: 04/22/2017              Requested by: No referring provider defined for this encounter. PCP: System, Pcp Not In  Chief Complaint  Patient presents with  . Right Ankle - Routine Post Op      HPI: Patient is a 46 year old gentleman who presents 1 week status post bimalleolar Weber C ankle fracture on the right with syndesmosis injury.  Assessment & Plan: Visit Diagnoses:  1. Bimalleolar ankle fracture, right, closed, initial encounter     Plan: Patient will work on dorsiflexion exercises of his ankle continue nonweightbearing okay to bathe and shower recommended a knee-high 15-20 mm compression stocking.  Repeat three-view radiographs of the right ankle at follow-up at which time I anticipate he can be released to light duty work  Follow-Up Instructions: Return in about 2 weeks (around 05/06/2017).   Ortho Exam  Patient is alert, oriented, no adenopathy, well-dressed, normal affect, normal respiratory effort. Examination the incision is healing nicely the wound has no dehiscence no redness no cellulitis no drainage.  Patient has no pain with passive range of motion of the ankle.  The calf is soft nontender no evidence of DVT.  Imaging: Xr Ankle Complete Right  Result Date: 04/22/2017 Three-view radiographs of the right ankle shows the fibula out to length.  The mortise is congruent.  The medial malleolar fracture is congruent and the syndesmosis is reduced.  No images are attached to the encounter.  Labs: No results found for: HGBA1C, ESRSEDRATE, CRP, LABURIC, REPTSTATUS, GRAMSTAIN, CULT, LABORGA  Orders:  Orders Placed This Encounter  Procedures  . XR Ankle Complete Right   No orders of the defined types were placed in this encounter.    Procedures: No procedures performed  Clinical Data: No additional findings.  ROS:  All other  systems negative, except as noted in the HPI. Review of Systems  Objective: Vital Signs: There were no vitals taken for this visit.  Specialty Comments:  No specialty comments available.  PMFS History: Patient Active Problem List   Diagnosis Date Noted  . Bimalleolar ankle fracture, right, closed, initial encounter 04/13/2017  . HYPERLIPIDEMIA-MIXED 08/22/2009  . CHEST PAIN UNSPECIFIED 08/22/2009   Past Medical History:  Diagnosis Date  . Anxiety   . Arthritis   . Asthma    not diagnosed  . GERD (gastroesophageal reflux disease)     Family History  Problem Relation Age of Onset  . Heart disease Father   . Stroke Mother   . Hypertension Mother   . Hyperlipidemia Mother     Past Surgical History:  Procedure Laterality Date  . ORIF ANKLE FRACTURE Right 04/15/2017   Procedure: OPEN REDUCTION INTERNAL FIXATION (ORIF) RIGHT ANKLE AND SYNDESMOSIS;  Surgeon: Nadara Mustarduda, Marcus V, MD;  [facility];  Service: Orthopedics;  Laterality: Right;  . TOOTH EXTRACTION     9 removed   Social History   Occupational History  . Not on file.   Social History Main Topics  . Smoking status: Former Smoker    Years: 15.00  . Smokeless tobacco: Never Used     Comment: quit 1998 ish  . Alcohol use Yes     Comment: monthy maybe  . Drug use: No  . Sexual activity: Not on file

## 2017-05-06 ENCOUNTER — Ambulatory Visit (INDEPENDENT_AMBULATORY_CARE_PROVIDER_SITE_OTHER): Payer: BLUE CROSS/BLUE SHIELD

## 2017-05-06 ENCOUNTER — Ambulatory Visit (INDEPENDENT_AMBULATORY_CARE_PROVIDER_SITE_OTHER): Payer: BLUE CROSS/BLUE SHIELD | Admitting: Family

## 2017-05-06 ENCOUNTER — Ambulatory Visit (INDEPENDENT_AMBULATORY_CARE_PROVIDER_SITE_OTHER): Payer: BLUE CROSS/BLUE SHIELD | Admitting: Orthopedic Surgery

## 2017-05-06 ENCOUNTER — Encounter (INDEPENDENT_AMBULATORY_CARE_PROVIDER_SITE_OTHER): Payer: Self-pay | Admitting: Family

## 2017-05-06 DIAGNOSIS — S82841D Displaced bimalleolar fracture of right lower leg, subsequent encounter for closed fracture with routine healing: Secondary | ICD-10-CM | POA: Diagnosis not present

## 2017-05-06 NOTE — Progress Notes (Signed)
   Office Visit Note   Patient: Tristan Thomas           Date of Birth: 01/31/1971           MRN: [id number] Visit Date: 05/06/2017              Requested by: No referring provider defined for this encounter. PCP: System, Pcp Not In  Chief Complaint  Patient presents with  . Right Ankle - Follow-up      HPI: Patient is a 46 year old gentleman who presents 3 weeks status post bimalleolar Weber C ankle fracture on the right with syndesmosis injury. Has been nonweightbearing in CAM with crutches.  Assessment & Plan: Visit Diagnoses:  1. Closed displaced bimalleolar fracture of right lower leg with routine healing     Plan: Patient will work on dorsiflexion exercises of his ankle continue nonweightbearing okay to bathe and shower recommended a knee-high 15-20 mm compression stocking. Did provide note for work. May return for seated light duty.   Repeat three-view radiographs of the right ankle at follow-up.  Follow-Up Instructions: Return in about 2 weeks (around 05/20/2017).   Ortho Exam  Patient is alert, oriented, no adenopathy, well-dressed, normal affect, normal respiratory effort. Examination the incision is well healed. wound has no dehiscence no redness no cellulitis no drainage.  Patient has no pain with passive range of motion of the ankle. Stiffness, dorsiflexion to neutral. The calf is soft nontender no evidence of DVT.  Imaging: Xr Ankle Complete Right  Result Date: 05/06/2017 Radiographs of right ankle show stable alignment of hardware following ORIF bimalleolar ankle fracture with syndesmosis injury. No complicating features.  No images are attached to the encounter.  Labs: No results found for: HGBA1C, ESRSEDRATE, CRP, LABURIC, REPTSTATUS, GRAMSTAIN, CULT, LABORGA  Orders:  Orders Placed This Encounter  Procedures  . XR Ankle Complete Right   No orders of the defined types were placed in this encounter.    Procedures: No procedures  performed  Clinical Data: No additional findings.  ROS:  All other systems negative, except as noted in the HPI. Review of Systems  Objective: Vital Signs: There were no vitals taken for this visit.  Specialty Comments:  No specialty comments available.  PMFS History: Patient Active Problem List   Diagnosis Date Noted  . Bimalleolar ankle fracture, right, closed, initial encounter 04/13/2017  . HYPERLIPIDEMIA-MIXED 08/22/2009  . CHEST PAIN UNSPECIFIED 08/22/2009   Past Medical History:  Diagnosis Date  . Anxiety   . Arthritis   . Asthma    not diagnosed  . GERD (gastroesophageal reflux disease)     Family History  Problem Relation Age of Onset  . Heart disease Father   . Stroke Mother   . Hypertension Mother   . Hyperlipidemia Mother     Past Surgical History:  Procedure Laterality Date  . TOOTH EXTRACTION     9 removed   Social History   Occupational History  . Not on file  Tobacco Use  . Smoking status: Former Smoker    Years: 15.00  . Smokeless tobacco: Never Used  . Tobacco comment: quit 1998 ish  Substance and Sexual Activity  . Alcohol use: Yes    Comment: monthy maybe  . Drug use: No  . Sexual activity: Not on file

## 2017-05-20 ENCOUNTER — Ambulatory Visit (INDEPENDENT_AMBULATORY_CARE_PROVIDER_SITE_OTHER): Payer: BLUE CROSS/BLUE SHIELD | Admitting: Orthopedic Surgery

## 2017-05-20 ENCOUNTER — Ambulatory Visit (INDEPENDENT_AMBULATORY_CARE_PROVIDER_SITE_OTHER): Payer: BLUE CROSS/BLUE SHIELD

## 2017-05-20 ENCOUNTER — Encounter (INDEPENDENT_AMBULATORY_CARE_PROVIDER_SITE_OTHER): Payer: Self-pay | Admitting: Orthopedic Surgery

## 2017-05-20 DIAGNOSIS — S82841A Displaced bimalleolar fracture of right lower leg, initial encounter for closed fracture: Secondary | ICD-10-CM

## 2017-05-20 NOTE — Progress Notes (Signed)
Office Visit Note   Patient: Tristan Thomas           Date of Birth: May 31, 1971           MRN: [id number] Visit Date: 05/20/2017              Requested by: No referring provider defined for this encounter. PCP: System, Pcp Not In  No chief complaint on file.     HPI: Patient is a 46 year old gentleman presents 4 weeks status post open reduction internal fixation bimalleolar ankle fracture with syndesmotic injury and segmental fibular fracture.  Patient states he took his fracture boot off on his own and has been ambulating with a cane.  Patient denies any pain with weightbearing.  Assessment & Plan: Visit Diagnoses:  1. Bimalleolar ankle fracture, right, closed, initial encounter     Plan: Discussed that he could return to work at sedentary level will work increase activities as tolerated on December 3.  Discussed the importance of not trying to work through pain.  Patient states he understands repeat three-view radiographs of the right ankle at follow-up.  Follow-Up Instructions: Return in about 6 weeks (around 07/01/2017).   Ortho Exam  Patient is alert, oriented, no adenopathy, well-dressed, normal affect, normal respiratory effort. Examination incisions are well-healed there is no redness there is no keloiding of the scar he has good range of motion of the ankle he has dorsiflexion to neutral.  He states his dorsiflexion is improving.  Imaging: Xr Ankle Complete Right  Result Date: 05/20/2017 Three-view radiographs of the right ankle shows a congruent mortise no loosening of the syndesmotic screw the medial malleolus is well aligned and congruent the mortise is congruent.  There is callus formation proximally with no evidence of hardware failure along the fibula.  No images are attached to the encounter.  Labs: No results found for: HGBA1C, ESRSEDRATE, CRP, LABURIC, REPTSTATUS, GRAMSTAIN, CULT, LABORGA  @LABSALLVALUES (HGBA1)@  @BMI1 @  Orders:  Orders Placed This  Encounter  Procedures  . XR Ankle Complete Right   No orders of the defined types were placed in this encounter.    Procedures: No procedures performed  Clinical Data: No additional findings.  ROS:  All other systems negative, except as noted in the HPI. Review of Systems  Objective: Vital Signs: There were no vitals taken for this visit.  Specialty Comments:  No specialty comments available.  PMFS History: Patient Active Problem List   Diagnosis Date Noted  . Bimalleolar ankle fracture, right, closed, initial encounter 04/13/2017  . HYPERLIPIDEMIA-MIXED 08/22/2009  . CHEST PAIN UNSPECIFIED 08/22/2009   Past Medical History:  Diagnosis Date  . Anxiety   . Arthritis   . Asthma    not diagnosed  . GERD (gastroesophageal reflux disease)     Family History  Problem Relation Age of Onset  . Heart disease Father   . Stroke Mother   . Hypertension Mother   . Hyperlipidemia Mother     Past Surgical History:  Procedure Laterality Date  . ORIF ANKLE FRACTURE Right 04/15/2017   Procedure: OPEN REDUCTION INTERNAL FIXATION (ORIF) RIGHT ANKLE AND SYNDESMOSIS;  Surgeon: Nadara Mustarduda, Carmela Piechowski V, MD;  [facility];  Service: Orthopedics;  Laterality: Right;  . TOOTH EXTRACTION     9 removed   Social History   Occupational History  . Not on file  Tobacco Use  . Smoking status: Former Smoker    Years: 15.00  . Smokeless tobacco: Never Used  . Tobacco comment: quit 1998  ish  Substance and Sexual Activity  . Alcohol use: Yes    Comment: monthy maybe  . Drug use: No  . Sexual activity: Not on file

## 2017-07-03 ENCOUNTER — Encounter (INDEPENDENT_AMBULATORY_CARE_PROVIDER_SITE_OTHER): Payer: Self-pay | Admitting: Family

## 2017-07-03 ENCOUNTER — Ambulatory Visit (INDEPENDENT_AMBULATORY_CARE_PROVIDER_SITE_OTHER): Payer: BLUE CROSS/BLUE SHIELD

## 2017-07-03 ENCOUNTER — Ambulatory Visit (INDEPENDENT_AMBULATORY_CARE_PROVIDER_SITE_OTHER): Payer: BLUE CROSS/BLUE SHIELD | Admitting: Family

## 2017-07-03 DIAGNOSIS — S82841D Displaced bimalleolar fracture of right lower leg, subsequent encounter for closed fracture with routine healing: Secondary | ICD-10-CM | POA: Diagnosis not present

## 2017-07-03 NOTE — Progress Notes (Signed)
   Office Visit Note   Patient: Tristan Thomas           Date of Birth: [birth year]           MRN: [id number] Visit Date: 07/03/2017              Requested by: No referring provider defined for this encounter. PCP: System, Pcp Not In  No chief complaint on file.     HPI: Patient is a 47 year old gentleman presents status post open reduction internal fixation bimalleolar ankle fracture with syndesmotic injury and segmental fibular fracture on 04/15/17.  Patient states he has return to full duty work. Patient denies any pain with weightbearing. Occasional aching pain. States can tell if he "over does" it a work. No concerns voiced.  Assessment & Plan: Visit Diagnoses:  1. Closed bimalleolar fracture of right ankle with routine healing, subsequent encounter     Plan: will follow up in office as needed.   Follow-Up Instructions: Return if symptoms worsen or fail to improve.   Ortho Exam  Patient is alert, oriented, no adenopathy, well-dressed, normal affect, normal respiratory effort. Examination incisions are well-healed there is no redness there is no keloiding of the scar he has good range of motion of the ankle he has dorsiflexion to neutral.  He states his dorsiflexion is improving.  Imaging: No results found. No images are attached to the encounter.  Labs: No results found for: HGBA1C, ESRSEDRATE, CRP, LABURIC, REPTSTATUS, GRAMSTAIN, CULT, LABORGA  @LABSALLVALUES (HGBA1)@  @BMI1 @  Orders:  Orders Placed This Encounter  Procedures  . XR Ankle Complete Right   No orders of the defined types were placed in this encounter.    Procedures: No procedures performed  Clinical Data: No additional findings.  ROS:  All other systems negative, except as noted in the HPI. Review of Systems  Constitutional: Negative for chills and fever.  Musculoskeletal: Positive for arthralgias.    Objective: Vital Signs: There were no vitals taken for this visit.  Specialty  Comments:  No specialty comments available.  PMFS History: Patient Active Problem List   Diagnosis Date Noted  . Bimalleolar ankle fracture, right, closed, initial encounter 04/13/2017  . HYPERLIPIDEMIA-MIXED 08/22/2009  . CHEST PAIN UNSPECIFIED 08/22/2009   Past Medical History:  Diagnosis Date  . Anxiety   . Arthritis   . Asthma    not diagnosed  . GERD (gastroesophageal reflux disease)     Family History  Problem Relation Age of Onset  . Heart disease Father   . Stroke Mother   . Hypertension Mother   . Hyperlipidemia Mother     Past Surgical History:  Procedure Laterality Date  . ORIF ANKLE FRACTURE Right 04/15/2017   Procedure: OPEN REDUCTION INTERNAL FIXATION (ORIF) RIGHT ANKLE AND SYNDESMOSIS;  Surgeon: Nadara Mustarduda, Marcus V, MD;  [facility];  Service: Orthopedics;  Laterality: Right;  . TOOTH EXTRACTION     9 removed   Social History   Occupational History  . Not on file  Tobacco Use  . Smoking status: Former Smoker    Years: 15.00  . Smokeless tobacco: Never Used  . Tobacco comment: quit 1998 ish  Substance and Sexual Activity  . Alcohol use: Yes    Comment: monthy maybe  . Drug use: No  . Sexual activity: Not on file

## 2017-07-13 DIAGNOSIS — J019 Acute sinusitis, unspecified: Secondary | ICD-10-CM | POA: Diagnosis not present

## 2017-09-30 ENCOUNTER — Encounter: Payer: Self-pay | Admitting: Physician Assistant

## 2017-11-27 DIAGNOSIS — F331 Major depressive disorder, recurrent, moderate: Secondary | ICD-10-CM | POA: Diagnosis not present

## 2017-11-30 DIAGNOSIS — F332 Major depressive disorder, recurrent severe without psychotic features: Secondary | ICD-10-CM | POA: Diagnosis not present

## 2017-12-25 DIAGNOSIS — F332 Major depressive disorder, recurrent severe without psychotic features: Secondary | ICD-10-CM | POA: Diagnosis not present

## 2017-12-25 DIAGNOSIS — F331 Major depressive disorder, recurrent, moderate: Secondary | ICD-10-CM | POA: Diagnosis not present

## 2018-01-08 DIAGNOSIS — F332 Major depressive disorder, recurrent severe without psychotic features: Secondary | ICD-10-CM | POA: Diagnosis not present

## 2018-01-16 ENCOUNTER — Ambulatory Visit (HOSPITAL_COMMUNITY): Payer: Self-pay | Admitting: Psychiatry

## 2018-01-16 ENCOUNTER — Encounter

## 2018-01-22 DIAGNOSIS — F332 Major depressive disorder, recurrent severe without psychotic features: Secondary | ICD-10-CM | POA: Diagnosis not present

## 2018-02-19 DIAGNOSIS — F332 Major depressive disorder, recurrent severe without psychotic features: Secondary | ICD-10-CM | POA: Diagnosis not present

## 2018-05-18 DIAGNOSIS — F332 Major depressive disorder, recurrent severe without psychotic features: Secondary | ICD-10-CM | POA: Diagnosis not present

## 2018-05-26 DIAGNOSIS — J309 Allergic rhinitis, unspecified: Secondary | ICD-10-CM | POA: Diagnosis not present

## 2018-05-26 DIAGNOSIS — G43009 Migraine without aura, not intractable, without status migrainosus: Secondary | ICD-10-CM | POA: Diagnosis not present

## 2018-05-26 DIAGNOSIS — K219 Gastro-esophageal reflux disease without esophagitis: Secondary | ICD-10-CM | POA: Diagnosis not present

## 2018-05-26 DIAGNOSIS — E785 Hyperlipidemia, unspecified: Secondary | ICD-10-CM | POA: Diagnosis not present

## 2018-07-17 ENCOUNTER — Ambulatory Visit (HOSPITAL_COMMUNITY)
Admission: EM | Admit: 2018-07-17 | Discharge: 2018-07-17 | Disposition: A | Discharge status: Home / Self Care | Payer: BLUE CROSS/BLUE SHIELD

## 2018-07-17 ENCOUNTER — Encounter (HOSPITAL_COMMUNITY): Payer: Self-pay | Admitting: Emergency Medicine

## 2018-07-17 ENCOUNTER — Other Ambulatory Visit: Payer: Self-pay

## 2018-07-17 DIAGNOSIS — B349 Viral infection, unspecified: Secondary | ICD-10-CM | POA: Diagnosis not present

## 2018-07-17 MED ORDER — CETIRIZINE HCL 10 MG PO CAPS: 10.0000 mg | ORAL_CAPSULE | Freq: Every day | ORAL | 0 refills | Status: AC

## 2018-07-17 MED ORDER — IBUPROFEN 600 MG PO TABS: 600.0000 mg | ORAL_TABLET | Freq: Four times a day (QID) | ORAL | 0 refills | Status: AC | PRN

## 2018-07-17 NOTE — ED Triage Notes (Signed)
Symptoms started Thursday afternoon.  Complains of sinus pressure, sinus drainage.

## 2018-07-17 NOTE — Discharge Instructions (Signed)
Please continue sudafed or mucinex Add in daily cetrizine/zyrtec or claritin to further help with congestion, watery eyes  Use anti-inflammatories for headache pain/swelling. You may take up to 600 mg Ibuprofen every 8 hours with food. You may supplement Ibuprofen with Tylenol [phone] mg every 8 hours.   Rest Drink plenty of fluids Follow up if symptoms not resolving in 5-7 days or worsening

## 2018-07-18 NOTE — ED Provider Notes (Signed)
[facility]    CSN: [id number] Arrival date & time: 07/17/18  1136     History   Chief Complaint Chief Complaint  Patient presents with  . URI    HPI Tristan Thomas is a 48 y.o. male no contributing past medical history presenting today for evaluation of sinus pressure.  Patient states that on Thursday he did started to develop a migraine headache.  States that this felt like a typical migraine for him.  But then symptoms turned into more frontal pressure behind his eyes.  Last night he started to developed watery eyes with occasional blurriness.  He has felt congested, but denies rhinorrhea.  He has had a mild sore throat.  Denies cough or fever.  He has tried Production designer, theatre/television/filmDayQuil and Sudafed.  Denies any associated GI symptoms with this.  HPI  Past Medical History:  Diagnosis Date  . Anxiety   . Arthritis   . Asthma    not diagnosed  . GERD (gastroesophageal reflux disease)     Patient Active Problem List   Diagnosis Date Noted  . Bimalleolar ankle fracture, right, closed, initial encounter 04/13/2017  . HYPERLIPIDEMIA-MIXED 08/22/2009  . CHEST PAIN UNSPECIFIED 08/22/2009    Past Surgical History:  Procedure Laterality Date  . ORIF ANKLE FRACTURE Right 04/15/2017   Procedure: OPEN REDUCTION INTERNAL FIXATION (ORIF) RIGHT ANKLE AND SYNDESMOSIS;  Surgeon: Nadara Mustarduda, Marcus V, MD;  [facility];  Service: Orthopedics;  Laterality: Right;  . TOOTH EXTRACTION     9 removed       Home Medications    Prior to Admission medications   Medication Sig Start Date End Date Taking? Authorizing Provider  buPROPion (WELLBUTRIN) 75 MG tablet Take 75 mg by mouth 2 (two) times daily.   Yes [provider]  Cetirizine HCl 10 MG CAPS Take 1 capsule (10 mg total) by mouth daily for 10 days. 07/17/18 07/27/18  Wieters, Hallie C, PA-C  fluticasone (FLONASE) 50 MCG/ACT nasal spray Place 1 spray into both nostrils daily as needed for allergies or rhinitis.    [provider]  ibuprofen (ADVIL,MOTRIN) 600 MG tablet Take 1 tablet (600 mg total) by mouth every 6 (six) hours as needed. 07/17/18   Wieters, Hallie C, PA-C  omeprazole (PRILOSEC) 20 MG capsule Take 20 mg by mouth 3 times/day as needed-between meals & bedtime.     [provider]  oxyCODONE-acetaminophen (PERCOCET) 5-325 MG tablet Take 1 tablet by mouth every 4 (four) hours as needed. Patient taking differently: Take 1 tablet by mouth every 4 (four) hours as needed.  04/13/17   Adonis HugueninZamora, Erin R, NP  oxyCODONE-acetaminophen (ROXICET) 5-325 MG tablet Take 1 tablet by mouth every 4 (four) hours as needed for severe pain. 04/15/17   Nadara Mustarduda, Marcus V, MD  SUMAtriptan (IMITREX) 100 MG tablet Take 100 mg by mouth every 2 (two) hours as needed for migraine. May repeat in 2 hours if headache persists or recurs.    [provider]    Family History Family History  Problem Relation Age of Onset  . Heart disease Father   . Stroke Mother   . Hypertension Mother   . Hyperlipidemia Mother     Social History Social History   Tobacco Use  . Smoking status: Former Smoker    Years: 15.00  . Smokeless tobacco: Never Used  . Tobacco comment: quit 1998 ish  Substance Use Topics  . Alcohol use: Yes    Comment: monthy maybe  . Drug  use: No     Allergies   Hydrocodone-acetaminophen   Review of Systems Review of Systems  Constitutional: Negative for activity change, appetite change, chills, fatigue and fever.  HENT: Positive for congestion, sinus pressure and sore throat. Negative for ear pain, rhinorrhea and trouble swallowing.   Eyes: Positive for discharge. Negative for redness.  Respiratory: Negative for cough, chest tightness and shortness of breath.   Cardiovascular: Negative for chest pain.  Gastrointestinal: Negative for abdominal pain, diarrhea, nausea and vomiting.  Musculoskeletal: Negative for myalgias.  Skin: Negative for rash.  Neurological: Positive for headaches. Negative for  dizziness and light-headedness.     Physical Exam Triage Vital Signs ED Triage Vitals  Enc Vitals Group     BP 07/17/18 1329 (!) 138/97     Pulse Rate 07/17/18 1329 78     Resp 07/17/18 1329 18     Temp 07/17/18 1329 98 F (36.7 C)     Temp Source 07/17/18 1329 Oral     SpO2 07/17/18 1329 98 %     Weight --      Height --      Head Circumference --      Peak Flow --      Pain Score 07/17/18 1326 6     Pain Loc --      Pain Edu? --      Excl. in GC? --    No data found.  Updated Vital Signs BP (!) 137/93 (BP Location: Left Arm) Comment (BP Location): large cuff  Pulse 78   Temp 98 F (36.7 C) (Oral)   Resp 18   SpO2 98%   Visual Acuity Right Eye Distance:   Left Eye Distance:   Bilateral Distance:    Right Eye Near:   Left Eye Near:    Bilateral Near:     Physical Exam Vitals signs and nursing note reviewed.  Constitutional:      Appearance: He is well-developed.  HENT:     Head: Normocephalic and atraumatic.     Ears:     Comments: Bilateral ears without tenderness to palpation of external auricle, tragus and mastoid, EAC's without erythema or swelling, TM's with good bony landmarks and cone of light. Non erythematous.    Nose:     Comments: Nasal mucosa mildly erythematous, minimally swollen turbinates    Mouth/Throat:     Comments: Oral mucosa pink and moist, no tonsillar enlargement or exudate. Posterior pharynx patent and nonerythematous, no uvula deviation or swelling. Normal phonation. Eyes:     Extraocular Movements: Extraocular movements intact.     Conjunctiva/sclera: Conjunctivae normal.     Pupils: Pupils are equal, round, and reactive to light.     Comments: Wearing glasses, no conjunctival erythema  Neck:     Musculoskeletal: Neck supple.  Cardiovascular:     Rate and Rhythm: Normal rate and regular rhythm.     Heart sounds: No murmur.  Pulmonary:     Effort: Pulmonary effort is normal. No respiratory distress.     Breath sounds: Normal  breath sounds.     Comments: Breathing comfortably at rest, CTABL, no wheezing, rales or other adventitious sounds auscultated Abdominal:     Palpations: Abdomen is soft.     Tenderness: There is no abdominal tenderness.  Skin:    General: Skin is warm and dry.  Neurological:     General: No focal deficit present.     Mental Status: He is alert and oriented to person, place, and  time. Mental status is at baseline.     Cranial Nerves: No cranial nerve deficit.     Gait: Gait normal.      UC Treatments / Results  Labs (all labs ordered are listed, but only abnormal results are displayed) Labs Reviewed - No data to display  EKG None  Radiology No results found.  Procedures Procedures (including critical care time)  Medications Ordered in UC Medications - No data to display  Initial Impression / Assessment and Plan / UC Course  I have reviewed the triage vital signs and the nursing notes.  Pertinent labs & imaging results that were available during my care of the patient were reviewed by me and considered in my medical decision making (see chart for details).    Patient with 1 to 2 days of sinus pressure, most likely viral etiology.  Will recommend initiation of decongestants, daily allergy pill to help with any watery drainage, postnasal drainage.  Anti-inflammatories for headache and pressure sensation.  Do not suspect any underlying intracranial pathology as cause of headache.  Will have patient continue to monitor symptoms and follow-up if symptoms not resolving, worsening or changing.Discussed strict return precautions. Patient verbalized understanding and is agreeable with plan.   Final Clinical Impressions(s) / UC Diagnoses   Final diagnoses:  Viral syndrome     Discharge Instructions     Please continue sudafed or mucinex Add in daily cetrizine/zyrtec or claritin to further help with congestion, watery eyes  Use anti-inflammatories for headache pain/swelling.  You may take up to 600 mg Ibuprofen every 8 hours with food. You may supplement Ibuprofen with Tylenol [phone] mg every 8 hours.   Rest Drink plenty of fluids Follow up if symptoms not resolving in 5-7 days or worsening    ED Prescriptions    Medication Sig Dispense Auth. Provider   Cetirizine HCl 10 MG CAPS Take 1 capsule (10 mg total) by mouth daily for 10 days. 10 capsule Wieters, Hallie C, PA-C   ibuprofen (ADVIL,MOTRIN) 600 MG tablet Take 1 tablet (600 mg total) by mouth every 6 (six) hours as needed. 30 tablet Wieters, HubbardHallie C, PA-C     Controlled Substance Prescriptions [place] Controlled Substance Registry consulted? Not Applicable   Lew DawesWieters, Hallie C, [place]PA-C 07/18/18 [phone]

## 2018-08-06 DIAGNOSIS — F419 Anxiety disorder, unspecified: Secondary | ICD-10-CM | POA: Diagnosis not present

## 2018-08-06 DIAGNOSIS — F332 Major depressive disorder, recurrent severe without psychotic features: Secondary | ICD-10-CM | POA: Diagnosis not present

## 2018-09-03 DIAGNOSIS — F332 Major depressive disorder, recurrent severe without psychotic features: Secondary | ICD-10-CM | POA: Diagnosis not present

## 2018-10-14 DIAGNOSIS — F900 Attention-deficit hyperactivity disorder, predominantly inattentive type: Secondary | ICD-10-CM | POA: Diagnosis not present

## 2018-10-14 DIAGNOSIS — F439 Reaction to severe stress, unspecified: Secondary | ICD-10-CM | POA: Diagnosis not present

## 2018-10-14 DIAGNOSIS — F332 Major depressive disorder, recurrent severe without psychotic features: Secondary | ICD-10-CM | POA: Diagnosis not present

## 2018-10-14 DIAGNOSIS — F419 Anxiety disorder, unspecified: Secondary | ICD-10-CM | POA: Diagnosis not present

## 2018-11-26 DIAGNOSIS — F419 Anxiety disorder, unspecified: Secondary | ICD-10-CM | POA: Diagnosis not present

## 2018-11-26 DIAGNOSIS — F900 Attention-deficit hyperactivity disorder, predominantly inattentive type: Secondary | ICD-10-CM | POA: Diagnosis not present

## 2018-11-26 DIAGNOSIS — F332 Major depressive disorder, recurrent severe without psychotic features: Secondary | ICD-10-CM | POA: Diagnosis not present

## 2018-11-26 DIAGNOSIS — F439 Reaction to severe stress, unspecified: Secondary | ICD-10-CM | POA: Diagnosis not present

## 2019-02-25 DIAGNOSIS — H2513 Age-related nuclear cataract, bilateral: Secondary | ICD-10-CM | POA: Diagnosis not present

## 2019-02-25 DIAGNOSIS — H524 Presbyopia: Secondary | ICD-10-CM | POA: Diagnosis not present

## 2019-02-25 DIAGNOSIS — H35361 Drusen (degenerative) of macula, right eye: Secondary | ICD-10-CM | POA: Diagnosis not present

## 2019-03-04 DIAGNOSIS — F419 Anxiety disorder, unspecified: Secondary | ICD-10-CM | POA: Diagnosis not present

## 2019-03-04 DIAGNOSIS — F900 Attention-deficit hyperactivity disorder, predominantly inattentive type: Secondary | ICD-10-CM | POA: Diagnosis not present

## 2019-03-04 DIAGNOSIS — Z720 Tobacco use: Secondary | ICD-10-CM | POA: Diagnosis not present

## 2019-03-04 DIAGNOSIS — F332 Major depressive disorder, recurrent severe without psychotic features: Secondary | ICD-10-CM | POA: Diagnosis not present

## 2019-03-25 DIAGNOSIS — M6740 Ganglion, unspecified site: Secondary | ICD-10-CM | POA: Diagnosis not present

## 2019-03-25 DIAGNOSIS — N3943 Post-void dribbling: Secondary | ICD-10-CM | POA: Diagnosis not present

## 2019-03-25 DIAGNOSIS — G43009 Migraine without aura, not intractable, without status migrainosus: Secondary | ICD-10-CM | POA: Diagnosis not present

## 2019-03-25 DIAGNOSIS — R350 Frequency of micturition: Secondary | ICD-10-CM | POA: Diagnosis not present

## 2019-04-01 DIAGNOSIS — E785 Hyperlipidemia, unspecified: Secondary | ICD-10-CM | POA: Diagnosis not present

## 2019-04-01 DIAGNOSIS — Z125 Encounter for screening for malignant neoplasm of prostate: Secondary | ICD-10-CM | POA: Diagnosis not present

## 2019-04-01 DIAGNOSIS — R350 Frequency of micturition: Secondary | ICD-10-CM | POA: Diagnosis not present

## 2019-04-01 DIAGNOSIS — Z0000 Encounter for general adult medical examination without abnormal findings: Secondary | ICD-10-CM | POA: Diagnosis not present

## 2019-04-08 DIAGNOSIS — Z0000 Encounter for general adult medical examination without abnormal findings: Secondary | ICD-10-CM | POA: Diagnosis not present

## 2019-04-08 DIAGNOSIS — N3943 Post-void dribbling: Secondary | ICD-10-CM | POA: Diagnosis not present

## 2019-04-08 DIAGNOSIS — G43009 Migraine without aura, not intractable, without status migrainosus: Secondary | ICD-10-CM | POA: Diagnosis not present

## 2019-04-08 DIAGNOSIS — K219 Gastro-esophageal reflux disease without esophagitis: Secondary | ICD-10-CM | POA: Diagnosis not present

## 2019-04-15 DIAGNOSIS — M25531 Pain in right wrist: Secondary | ICD-10-CM | POA: Diagnosis not present

## 2019-04-15 DIAGNOSIS — M25831 Other specified joint disorders, right wrist: Secondary | ICD-10-CM | POA: Diagnosis not present

## 2019-04-15 DIAGNOSIS — M25511 Pain in right shoulder: Secondary | ICD-10-CM | POA: Diagnosis not present

## 2019-04-15 DIAGNOSIS — M7551 Bursitis of right shoulder: Secondary | ICD-10-CM | POA: Diagnosis not present

## 2019-04-29 DIAGNOSIS — F411 Generalized anxiety disorder: Secondary | ICD-10-CM | POA: Diagnosis not present

## 2019-04-29 DIAGNOSIS — M25831 Other specified joint disorders, right wrist: Secondary | ICD-10-CM | POA: Diagnosis not present

## 2019-04-29 DIAGNOSIS — F439 Reaction to severe stress, unspecified: Secondary | ICD-10-CM | POA: Diagnosis not present

## 2019-04-29 DIAGNOSIS — Z720 Tobacco use: Secondary | ICD-10-CM | POA: Diagnosis not present

## 2019-04-29 DIAGNOSIS — F900 Attention-deficit hyperactivity disorder, predominantly inattentive type: Secondary | ICD-10-CM | POA: Diagnosis not present

## 2019-04-29 DIAGNOSIS — F331 Major depressive disorder, recurrent, moderate: Secondary | ICD-10-CM | POA: Diagnosis not present

## 2019-05-13 DIAGNOSIS — M25531 Pain in right wrist: Secondary | ICD-10-CM | POA: Diagnosis not present

## 2019-05-13 DIAGNOSIS — M25831 Other specified joint disorders, right wrist: Secondary | ICD-10-CM | POA: Diagnosis not present

## 2019-05-13 DIAGNOSIS — M7551 Bursitis of right shoulder: Secondary | ICD-10-CM | POA: Diagnosis not present

## 2019-07-15 DIAGNOSIS — F4323 Adjustment disorder with mixed anxiety and depressed mood: Secondary | ICD-10-CM | POA: Diagnosis not present

## 2019-08-14 IMAGING — DX DG CHEST 2V
2 series · 2 of 2 positions shown · non-contrast
Comparison: 08/14/2009

CLINICAL DATA: Productive cough with hemoptysis.

EXAM:
CHEST  2 VIEW

[chest pa]
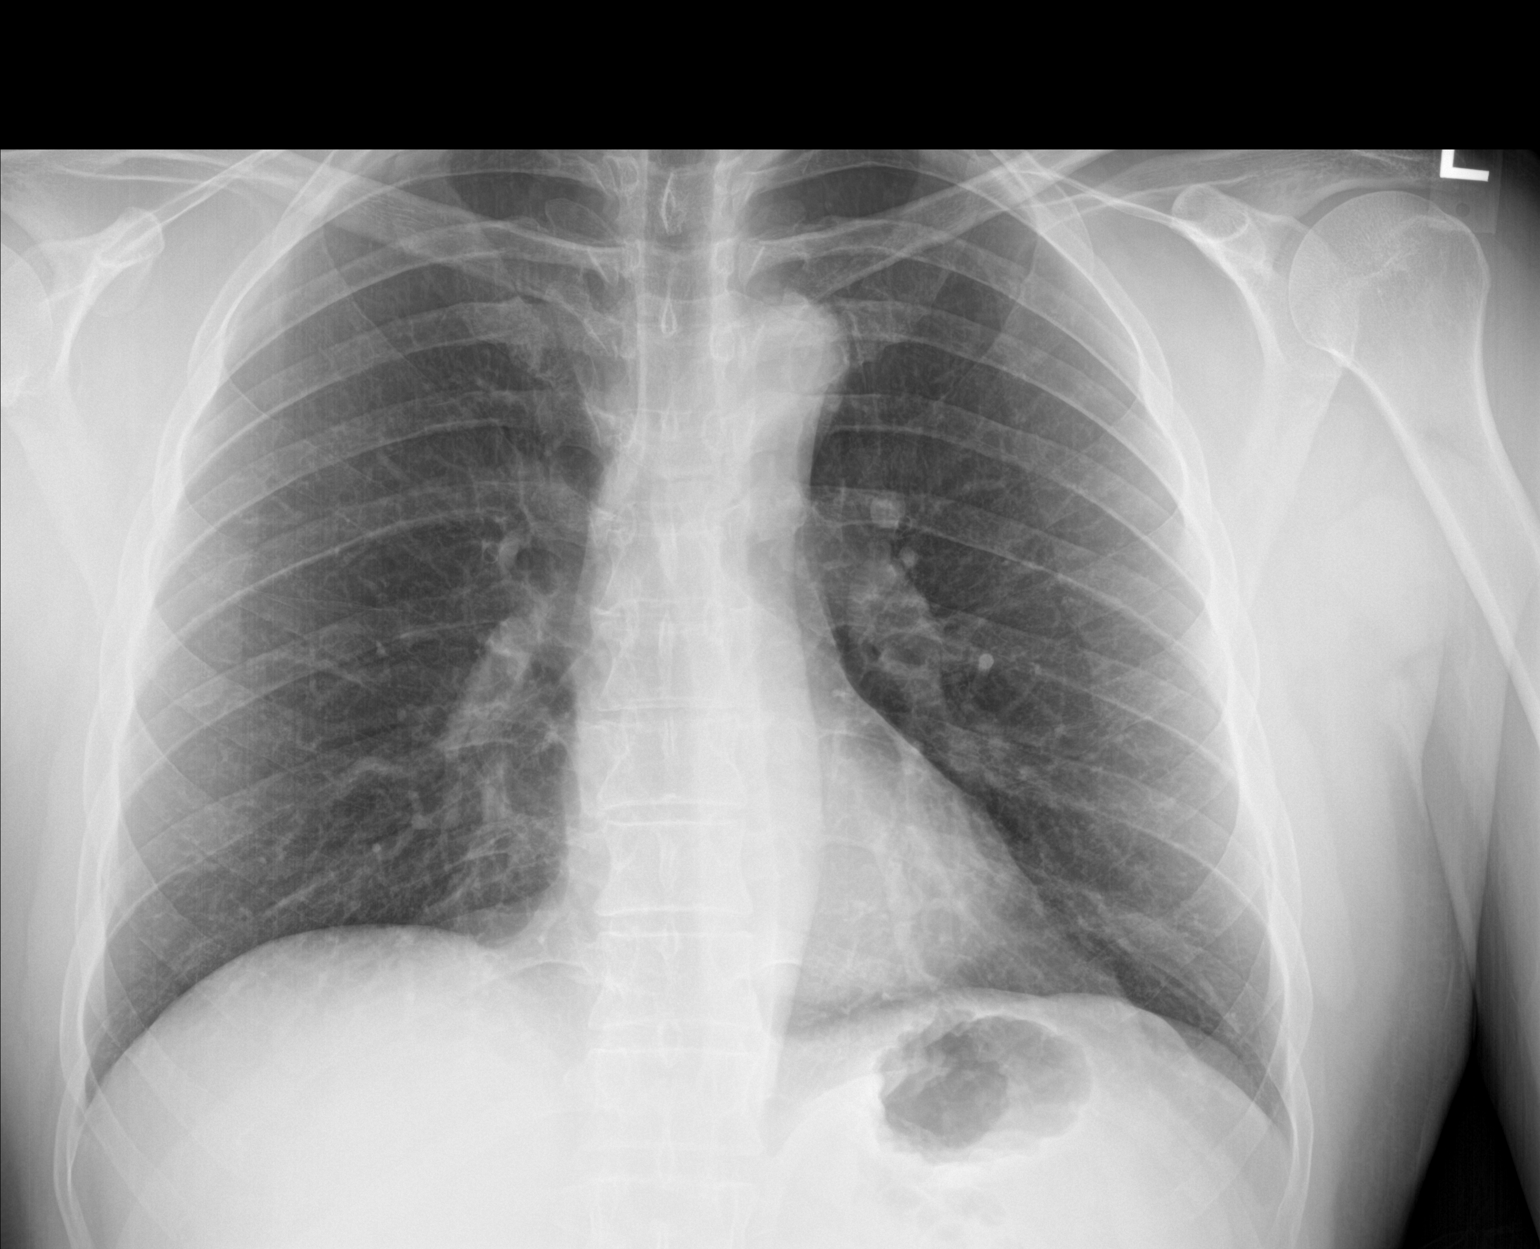

[chest lat]
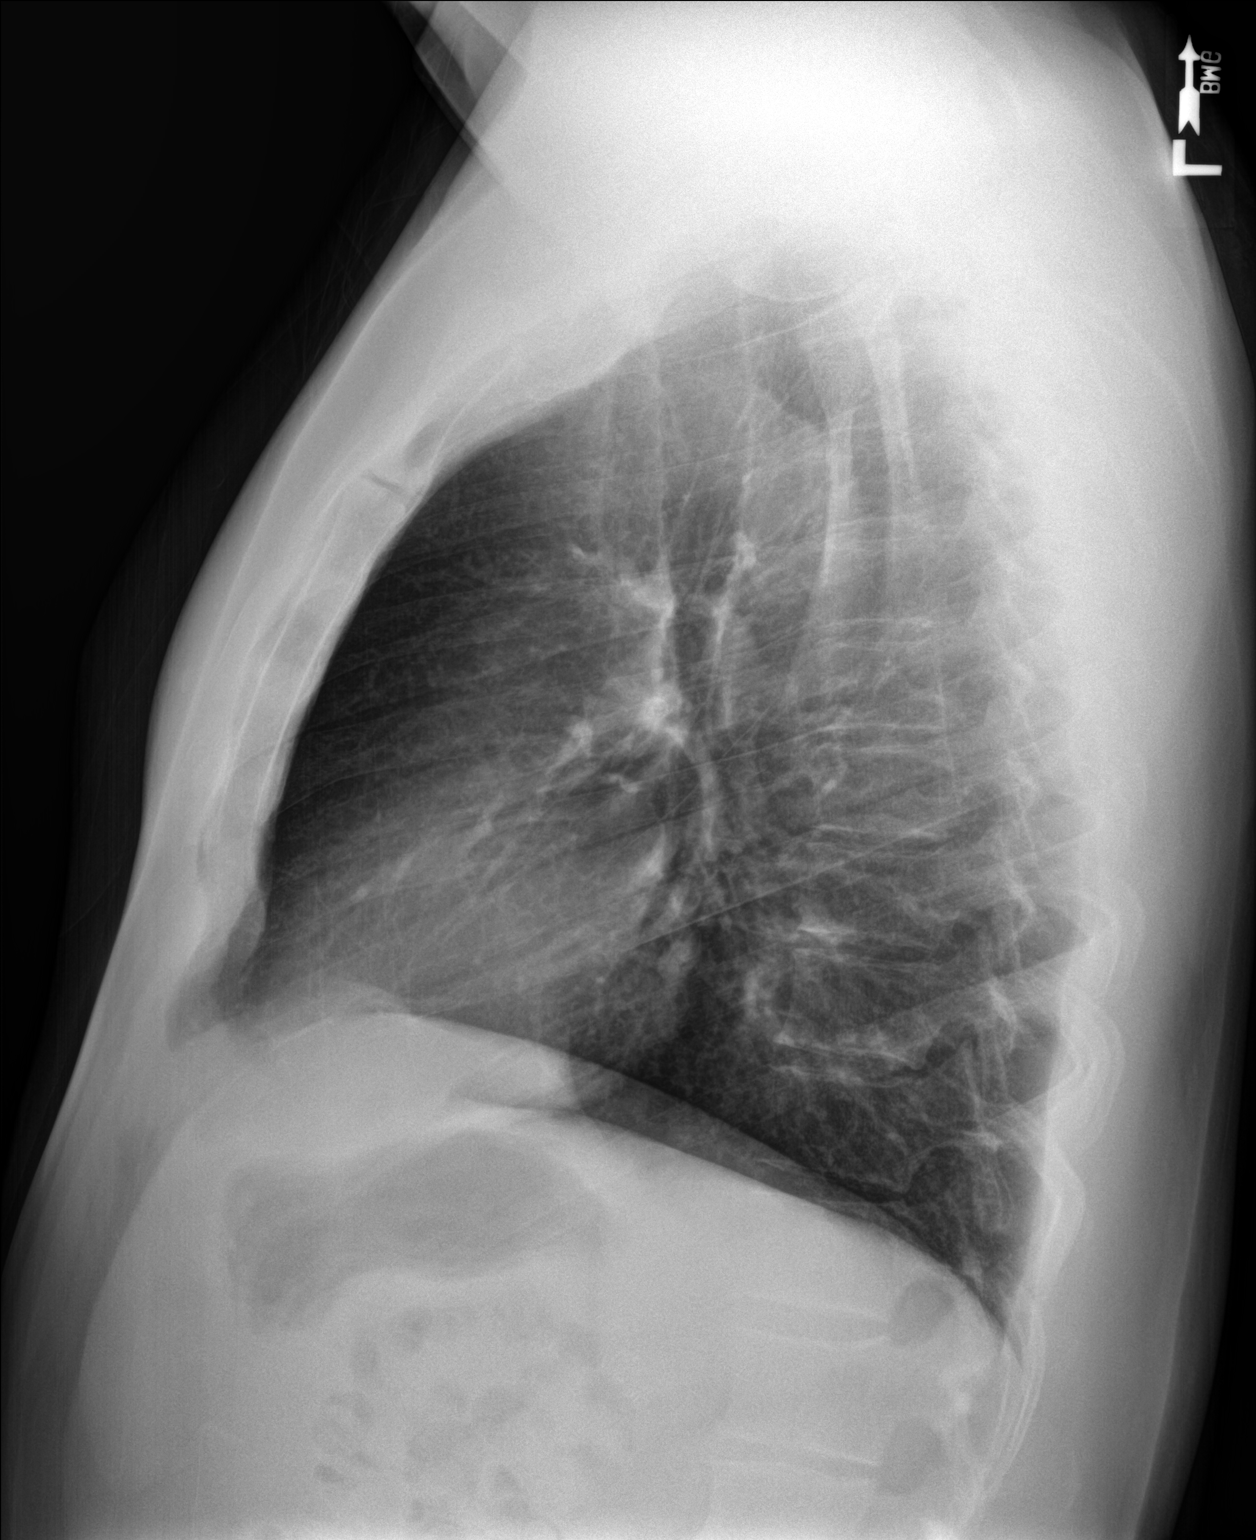

[2 of 2 positions shown; findings below may reference images not displayed]

FINDINGS: The lungs are clear without focal pneumonia, edema, pneumothorax or
pleural effusion. The cardiopericardial silhouette is within normal
limits for size. The visualized bony structures of the thorax are
intact.
IMPRESSION: Stable.  No acute cardiopulmonary findings.

## 2019-08-26 DIAGNOSIS — H35361 Drusen (degenerative) of macula, right eye: Secondary | ICD-10-CM | POA: Diagnosis not present

## 2019-08-26 DIAGNOSIS — H524 Presbyopia: Secondary | ICD-10-CM | POA: Diagnosis not present

## 2019-08-26 DIAGNOSIS — F4323 Adjustment disorder with mixed anxiety and depressed mood: Secondary | ICD-10-CM | POA: Diagnosis not present

## 2019-08-26 DIAGNOSIS — H2513 Age-related nuclear cataract, bilateral: Secondary | ICD-10-CM | POA: Diagnosis not present

## 2019-09-08 DIAGNOSIS — F411 Generalized anxiety disorder: Secondary | ICD-10-CM | POA: Diagnosis not present

## 2019-09-08 DIAGNOSIS — F331 Major depressive disorder, recurrent, moderate: Secondary | ICD-10-CM | POA: Diagnosis not present

## 2019-09-08 DIAGNOSIS — J309 Allergic rhinitis, unspecified: Secondary | ICD-10-CM | POA: Diagnosis not present

## 2019-09-08 DIAGNOSIS — R05 Cough: Secondary | ICD-10-CM | POA: Diagnosis not present

## 2019-09-08 DIAGNOSIS — F900 Attention-deficit hyperactivity disorder, predominantly inattentive type: Secondary | ICD-10-CM | POA: Diagnosis not present

## 2019-09-08 DIAGNOSIS — I10 Essential (primary) hypertension: Secondary | ICD-10-CM | POA: Diagnosis not present

## 2019-09-08 DIAGNOSIS — J399 Disease of upper respiratory tract, unspecified: Secondary | ICD-10-CM | POA: Diagnosis not present

## 2019-09-08 DIAGNOSIS — Z720 Tobacco use: Secondary | ICD-10-CM | POA: Diagnosis not present

## 2019-09-09 DIAGNOSIS — F4323 Adjustment disorder with mixed anxiety and depressed mood: Secondary | ICD-10-CM | POA: Diagnosis not present

## 2019-09-09 NOTE — Progress Notes (Signed)
[facility] - Clinic Note  09/16/2019     CHIEF COMPLAINT Patient presents for Retina Follow Up   HISTORY OF PRESENT ILLNESS: Tristan Thomas is a 49 y.o. male who presents to the clinic today for:   HPI    Retina Follow Up    Patient presents with  Other.  In both eyes.  This started weeks ago.  Severity is moderate.  Duration of weeks.  Since onset it is stable.  I, the attending physician,  performed the HPI with the patient and updated documentation appropriately.          Comments    Pt states his vision is stable in both eyes.  Patient denies eye pain or discomfort and denies any new or worsening floaters or fol OU.  Pt is a welder--wears safety glasses.       Last edited by Rennis Chris, MD on 09/16/2019 10:25 AM. (History)     Patient states vision seems good OU. States that he does have a lot of stress, lots of things going on at present.    Referring physician: Demarco, [place], OD [address] [place],  [place] [address]  HISTORICAL INFORMATION:   Selected notes from the MEDICAL RECORD NUMBER Referred by Dr. [place] DeMarco for concern of worsening PED LEE:  Ocular Hx- PMH-   CURRENT MEDICATIONS: No current outpatient medications on file. (Ophthalmic Drugs)   No current facility-administered medications for this visit. (Ophthalmic Drugs)   Current Outpatient Medications (Other)  Medication Sig  . buPROPion (WELLBUTRIN) 75 MG tablet Take 75 mg by mouth 2 (two) times daily.  . Cetirizine HCl 10 MG CAPS Take 1 capsule (10 mg total) by mouth daily for 10 days.  . fluticasone (FLONASE) 50 MCG/ACT nasal spray Place 1 spray into both nostrils daily as needed for allergies or rhinitis.  Marland Kitchen ibuprofen (ADVIL,MOTRIN) 600 MG tablet Take 1 tablet (600 mg total) by mouth every 6 (six) hours as needed.  Marland Kitchen omeprazole (PRILOSEC) 20 MG capsule Take 20 mg by mouth 3 times/day as needed-between meals & bedtime.   Marland Kitchen oxyCODONE-acetaminophen (PERCOCET)  5-325 MG tablet Take 1 tablet by mouth every 4 (four) hours as needed. (Patient taking differently: Take 1 tablet by mouth every 4 (four) hours as needed. )  . oxyCODONE-acetaminophen (ROXICET) 5-325 MG tablet Take 1 tablet by mouth every 4 (four) hours as needed for severe pain.  . SUMAtriptan (IMITREX) 100 MG tablet Take 100 mg by mouth every 2 (two) hours as needed for migraine. May repeat in 2 hours if headache persists or recurs.   No current facility-administered medications for this visit. (Other)      REVIEW OF SYSTEMS: ROS    Positive for: Eyes   Negative for: Constitutional, Gastrointestinal, Neurological, Skin, Genitourinary, Musculoskeletal, HENT, Endocrine, Cardiovascular, Respiratory, Psychiatric, Allergic/Imm, Heme/Lymph   Last edited by Corrinne Eagle on 09/16/2019  9:40 AM. (History)       ALLERGIES Allergies  Allergen Reactions  . Hydrocodone-Acetaminophen Other (See Comments)    Pt got cold-chills     PAST MEDICAL HISTORY Past Medical History:  Diagnosis Date  . Anxiety   . Arthritis   . Asthma    not diagnosed  . GERD (gastroesophageal reflux disease)    Past Surgical History:  Procedure Laterality Date  . ORIF ANKLE FRACTURE Right 04/15/2017   Procedure: OPEN REDUCTION INTERNAL FIXATION (ORIF) RIGHT ANKLE AND SYNDESMOSIS;  Surgeon: Nadara Mustard, MD;  Location: [facility] OR;  Service:  Orthopedics;  Laterality: Right;  . TOOTH EXTRACTION     9 removed    FAMILY HISTORY Family History  Problem Relation Age of Onset  . Heart disease Father   . Stroke Mother   . Hypertension Mother   . Hyperlipidemia Mother     SOCIAL HISTORY Social History   Tobacco Use  . Smoking status: Former Smoker    Years: 15.00  . Smokeless tobacco: Never Used  . Tobacco comment: quit 1998 ish  Substance Use Topics  . Alcohol use: Yes    Comment: monthy maybe  . Drug use: No         OPHTHALMIC EXAM:  Base Eye Exam    Visual Acuity (Snellen - Linear)       Right Left   Dist cc 20/20 -1 20/20 -1   Correction: Glasses       Tonometry (Tonopen, 9:47 AM)      Right Left   Pressure 13 16       Pupils      Dark Light Shape React APD   Right 2 1 Round Brisk 0   Left 2 1 Round Brisk 0       Visual Fields      Left Right    Full Full       Extraocular Movement      Right Left    Full Full       Neuro/Psych    Oriented x3: Yes   Mood/Affect: Normal       Dilation    Both eyes: 1.0% Mydriacyl, 2.5% Phenylephrine @ 9:47 AM        Slit Lamp and Fundus Exam    Slit Lamp Exam      Right Left   Lids/Lashes mild MGD  mild MGD   Conjunctiva/Sclera White and quiet White and quiet   Cornea trace PEE trace PEE   Anterior Chamber deep and clear, narrow temporal angle deep and clear, narrow temporal angle   Iris round and dilated round and dilated   Lens 1 + Nucleus sclerois, 1+ cortical 1 + Nucleus sclerois, 1+ cortical   Vitreous mild syneresis mild syneresis       Fundus Exam      Right Left   Disc compact, pink and sharp compact, pink and sharp   C/D Ratio 0.1 0.1   Macula blunted foveal reflex, focal CNVM with focal surrounding SRF nasal to fovea flat, good foveal reflex, mild RPE mottling, no heme or edema   Vessels mild Vascular attenuation,  mild Tortuous, mild copper wiring mild Vascular attenuation,  mild Tortuous, mild copper wiring, mild A/V crossing changes   Periphery attached, no heme attached, no heme        Refraction    Wearing Rx      Sphere Cylinder Axis   Right +2.50 +0.25 140   Left +1.50 +0.25 065   Age: 27 yr       Manifest Refraction      Sphere Cylinder Axis Dist VA   Right +2.25 +0.25 140 20/20   Left +1.50 +0.25 065 20/20          IMAGING AND PROCEDURES  Imaging and Procedures for @TODAY @  OCT, Retina - OU - Both Eyes       Right Eye Quality was good. Central Foveal Thickness: 296. Progression has no prior data. Findings include normal foveal contour, no IRF, subretinal fluid,  subretinal hyper-reflective material, vitreomacular adhesion  (Focal SRF nasal fovea--?  CSR).   Left Eye Quality was good. Central Foveal Thickness: 287. Progression has no prior data. Findings include normal foveal contour, no IRF, no SRF, vitreomacular adhesion .   Notes *Images captured and stored on drive  Diagnosis / Impression:  OD: NFP, no IRF, +SRF nasal fovea overlying PED/CNV OS: NFP, no IRF/SRF, VMA  Clinical management:  See below  Abbreviations: NFP - Normal foveal profile. CME - cystoid macular edema. PED - pigment epithelial detachment. IRF - intraretinal fluid. SRF - subretinal fluid. EZ - ellipsoid zone. ERM - epiretinal membrane. ORA - outer retinal atrophy. ORT - outer retinal tubulation. SRHM - subretinal hyper-reflective material        Fluorescein Angiography Optos (Transit OD)       Right Eye   Progression has no prior data. Early phase findings include staining, choroidal neovascularization. Mid/Late phase findings include leakage, choroidal neovascularization, blockage.   Left Eye   Progression has no prior data. Early phase findings include normal observations. Mid/Late phase findings include normal observations.   Notes Images stored on drive;   Impression: OD: focal CNV with late leakage nasal to fovea OS: normal                  ASSESSMENT/PLAN:    ICD-10-CM   1. 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 Exudative age-related macular degeneration of right eye with active choroidal neovascularization (HCC)  H35.3211   3. 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 Retinal edema  H35.81 OCT, Retina - OU - Both Eyes  5. Essential hypertension  I10   6. Hypertensive retinopathy of both eyes  H35.033 Fluorescein Angiography Optos (Transit OD)  7. 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 Hypertensive retinopathy OU - discussed importance of tight BP control - monitor  7.  Mixed cataracts OU - The symptoms of cataract, surgical options, and treatments and risks were discussed with patient. - discussed diagnosis and progression - not yet visually significant - monitor for now   Ophthalmic Meds Ordered this visit:  No orders of the defined types were placed in this encounter.     Return 2-3 weeks, for DFE, OCT.  There are no Patient Instructions on file for this visit.   Explained the diagnoses, plan, and follow up with the patient and they expressed understanding.  Patient expressed understanding of the importance of proper follow up care.   This document serves as a record of services personally performed by Karie Chimera, MD, PhD. It was created on their behalf by Annalee Genta, COMT. The creation of this record is the provider's dictation and/or activities during the visit.  Electronically signed by: Annalee Genta, COMT 09/16/19 11:19 PM  Karie Chimera, M.D., Ph.D. Diseases & Surgery of the Retina and Vitreous [facility]  I have reviewed the above documentation for accuracy and completeness, and I agree with  the above. Gardiner Sleeper, M.D., Ph.D. 09/16/19 11:19 PM   Abbreviations: M myopia (nearsighted); A astigmatism; H hyperopia (farsighted); P presbyopia; Mrx spectacle prescription;  CTL contact lenses; OD right eye; OS left eye; OU both eyes  XT exotropia; ET esotropia; PEK punctate epithelial keratitis; PEE punctate epithelial  erosions; DES dry eye syndrome; MGD meibomian gland dysfunction; ATs artificial tears; PFAT's preservative free artificial tears; [place] nuclear sclerotic cataract; PSC posterior subcapsular cataract; ERM epi-retinal membrane; PVD posterior vitreous detachment; RD retinal detachment; DM diabetes mellitus; DR diabetic retinopathy; NPDR non-proliferative diabetic retinopathy; PDR proliferative diabetic retinopathy; CSME clinically significant macular edema; DME diabetic macular edema; dbh dot blot hemorrhages; CWS cotton wool spot; POAG primary open angle glaucoma; C/D cup-to-disc ratio; HVF humphrey visual field; GVF goldmann visual field; OCT optical coherence tomography; IOP intraocular pressure; BRVO Branch retinal vein occlusion; CRVO central retinal vein occlusion; CRAO central retinal artery occlusion; BRAO branch retinal artery occlusion; RT retinal tear; SB scleral buckle; PPV pars plana vitrectomy; VH Vitreous hemorrhage; PRP panretinal laser photocoagulation; IVK intravitreal kenalog; VMT vitreomacular traction; MH Macular hole;  NVD neovascularization of the disc; NVE neovascularization elsewhere; AREDS age related eye disease study; ARMD age related macular degeneration; POAG primary open angle glaucoma; EBMD epithelial/anterior basement membrane dystrophy; ACIOL anterior chamber intraocular lens; IOL intraocular lens; PCIOL posterior chamber intraocular lens; Phaco/IOL phacoemulsification with intraocular lens placement; [place] photorefractive keratectomy; LASIK laser assisted in situ keratomileusis; HTN hypertension; DM diabetes mellitus; COPD chronic obstructive pulmonary disease

## 2019-09-16 ENCOUNTER — Ambulatory Visit (INDEPENDENT_AMBULATORY_CARE_PROVIDER_SITE_OTHER): Payer: BLUE CROSS/BLUE SHIELD | Admitting: Ophthalmology

## 2019-09-16 ENCOUNTER — Encounter (INDEPENDENT_AMBULATORY_CARE_PROVIDER_SITE_OTHER): Payer: Self-pay | Admitting: Ophthalmology

## 2019-09-16 VITALS — BP 140/98 | HR 77

## 2019-09-16 DIAGNOSIS — H25813 Combined forms of age-related cataract, bilateral: Secondary | ICD-10-CM

## 2019-09-16 DIAGNOSIS — H35711 Central serous chorioretinopathy, right eye: Secondary | ICD-10-CM

## 2019-09-16 DIAGNOSIS — H353211 Exudative age-related macular degeneration, right eye, with active choroidal neovascularization: Secondary | ICD-10-CM | POA: Diagnosis not present

## 2019-09-16 DIAGNOSIS — H3581 Retinal edema: Secondary | ICD-10-CM | POA: Diagnosis not present

## 2019-09-16 DIAGNOSIS — I10 Essential (primary) hypertension: Secondary | ICD-10-CM

## 2019-09-16 DIAGNOSIS — H35051 Retinal neovascularization, unspecified, right eye: Secondary | ICD-10-CM

## 2019-09-16 DIAGNOSIS — H35033 Hypertensive retinopathy, bilateral: Secondary | ICD-10-CM | POA: Diagnosis not present

## 2019-09-30 NOTE — Progress Notes (Signed)
[facility] Note  10/07/2019     CHIEF COMPLAINT Patient presents for Retina Follow Up   HISTORY OF PRESENT ILLNESS: Tristan Thomas is a 49 y.o. male who presents to the clinic today for:   HPI    Retina Follow Up    Patient presents with  Other.  In right eye.  This started 3 weeks ago.  Severity is mild.  Duration of 3 weeks.  Since onset it is stable.  I, the attending physician,  performed the HPI with the patient and updated documentation appropriately.          Comments    49 y/o male pt here for 3 wk f/u for idiopathic CNV vs CSR OD.  No change in [place] OU.  Denies pain, FOL, floaters.  Has noticed more photophobia and tearing OD over the past couple of wks.  No gtts.       Last edited by Bernarda Caffey, MD on 10/07/2019  1:33 PM. (History)    Patient  Referring physician: Demarco, [place], [place],  [place] [address]  HISTORICAL INFORMATION:   Selected notes from the MEDICAL RECORD NUMBER Referred by Dr. [place] DeMarco for concern of worsening PED   CURRENT MEDICATIONS: No current outpatient medications on file. (Ophthalmic Drugs)   No current facility-administered medications for this visit. (Ophthalmic Drugs)   Current Outpatient Medications (Other)  Medication Sig  . buPROPion (WELLBUTRIN) 75 MG tablet Take 75 mg by mouth 2 (two) times daily.  . busPIRone (BUSPAR) 5 MG tablet Take 5 mg by mouth 3 (three) times daily.  . fluticasone (FLONASE) 50 MCG/ACT nasal spray Place 1 spray into both nostrils daily as needed for allergies or rhinitis.  Marland Kitchen ibuprofen (ADVIL,MOTRIN) 600 MG tablet Take 1 tablet (600 mg total) by mouth every 6 (six) hours as needed.  . loratadine (CLARITIN) 10 MG tablet Take 10 mg by mouth daily.  Marland Kitchen omeprazole (PRILOSEC) 20 MG capsule Take 20 mg by mouth 3 times/day as needed-between meals & bedtime.   Marland Kitchen oxyCODONE-acetaminophen (PERCOCET) 5-325 MG tablet Take 1 tablet by mouth every 4 (four) hours  as needed. (Patient taking differently: Take 1 tablet by mouth every 4 (four) hours as needed. )  . oxyCODONE-acetaminophen (ROXICET) 5-325 MG tablet Take 1 tablet by mouth every 4 (four) hours as needed for severe pain.  . SUMAtriptan (IMITREX) 100 MG tablet Take 100 mg by mouth every 2 (two) hours as needed for migraine. May repeat in 2 hours if headache persists or recurs.  . tamsulosin (FLOMAX) 0.4 MG CAPS capsule tamsulosin 0.4 mg capsule  . venlafaxine XR (EFFEXOR-XR) 150 MG 24 hr capsule Take 150 mg by mouth daily.  . Cetirizine HCl 10 MG CAPS Take 1 capsule (10 mg total) by mouth daily for 10 days.   No current facility-administered medications for this visit. (Other)      REVIEW OF SYSTEMS: ROS    Positive for: Eyes   Negative for: Constitutional, Gastrointestinal, Neurological, Skin, Genitourinary, Musculoskeletal, HENT, Endocrine, Cardiovascular, Respiratory, Psychiatric, Allergic/Imm, Heme/Lymph   Last edited by Matthew Folks, COA on 10/07/2019  9:37 AM. (History)       ALLERGIES Allergies  Allergen Reactions  . Hydrocodone-Acetaminophen Other (See Comments)    Pt got cold-chills     PAST MEDICAL HISTORY Past Medical History:  Diagnosis Date  . Anxiety   . Arthritis   . Asthma    not diagnosed  . GERD (gastroesophageal reflux  disease)    Past Surgical History:  Procedure Laterality Date  . ORIF ANKLE FRACTURE Right 04/15/2017   Procedure: OPEN REDUCTION INTERNAL FIXATION (ORIF) RIGHT ANKLE AND SYNDESMOSIS;  Surgeon: Nadara Mustard, MD;  Location: [facility] OR;  Service: Orthopedics;  Laterality: Right;  . TOOTH EXTRACTION     9 removed    FAMILY HISTORY Family History  Problem Relation Age of Onset  . Heart disease Father   . Stroke Mother   . Hypertension Mother   . Hyperlipidemia Mother     SOCIAL HISTORY Social History   Tobacco Use  . Smoking status: Former Smoker    Years: 15.00  . Smokeless tobacco: Never Used  . Tobacco comment: quit 1998 ish   Substance Use Topics  . Alcohol use: Yes    Comment: monthy maybe  . Drug use: No         OPHTHALMIC EXAM:  Base Eye Exam    Visual Acuity (Snellen - Linear)      Right Left   Dist cc 20/25 20/25 -2   Dist ph cc NI 20/20 -2       Tonometry (Tonopen, 9:40 AM)      Right Left   Pressure 12 15       Pupils      Dark Light Shape React APD   Right 2 1 Round Brisk None   Left 2 1 Round Brisk None       Visual Fields (Counting fingers)      Left Right    Full Full       Extraocular Movement      Right Left    Full, Ortho Full, Ortho       Neuro/Psych    Oriented x3: Yes   Mood/Affect: Normal       Dilation    Both eyes: 1.0% Mydriacyl, 2.5% Phenylephrine @ 9:40 AM        Slit Lamp and Fundus Exam    Slit Lamp Exam      Right Left   Lids/Lashes mild MGD  mild MGD   Conjunctiva/Sclera White and quiet White and quiet   Cornea trace PEE trace PEE   Anterior Chamber deep and clear, narrow temporal angle deep and clear, narrow temporal angle   Iris round and dilated round and dilated   Lens 1 + Nucleus sclerois, 1+ cortical 1 + Nucleus sclerois, 1+ cortical   Vitreous mild syneresis mild syneresis       Fundus Exam      Right Left   Disc compact, pink and sharp compact, pink and sharp   C/D Ratio 0.1 0.1   Macula blunted foveal reflex, focal CNVM with focal surrounding SRF nasal to fovea -- persistent ?slightly increased flat, good foveal reflex, mild RPE mottling, no heme or edema   Vessels mild Vascular attenuation,  mild Tortuous, mild copper wiring mild Vascular attenuation,  mild Tortuous, mild copper wiring, mild A/V crossing changes   Periphery attached, no heme attached, no heme          IMAGING AND PROCEDURES  Imaging and Procedures for @TODAY @  OCT, Retina - OU - Both Eyes       Right Eye Quality was good. Central Foveal Thickness: 281. Progression has worsened. Findings include normal foveal contour, no IRF, subretinal fluid, subretinal  hyper-reflective material, vitreomacular adhesion  (Focal SRF nasal fovea--slightly increased ?CSR).   Left Eye Quality was good. Central Foveal Thickness: 289. Progression has been stable. Findings include  normal foveal contour, no IRF, no SRF, vitreomacular adhesion .   Notes *Images captured and stored on drive  Diagnosis / Impression:  OD: Focal SRF nasal fovea--slightly increased -- CNV v CSR OS: NFP, no IRF/SRF, VMA  Clinical management:  See below  Abbreviations: NFP - Normal foveal profile. CME - cystoid macular edema. PED - pigment epithelial detachment. IRF - intraretinal fluid. SRF - subretinal fluid. EZ - ellipsoid zone. ERM - epiretinal membrane. ORA - outer retinal atrophy. ORT - outer retinal tubulation. SRHM - subretinal hyper-reflective material        Intravitreal Injection, Pharmacologic Agent - OD - Right Eye       Time Out 10/07/2019. 11:48 AM. Confirmed correct patient, procedure, site, and patient consented.   Anesthesia Topical anesthesia was used. Anesthetic medications included Lidocaine 2%, Proparacaine 0.5%.   Procedure Preparation included 5% betadine to ocular surface, eyelid speculum. A (32g) needle was used.   Injection:  1.25 mg Bevacizumab (AVASTIN) SOLN   NDC: 07371-062-69, Lot: [id number] , Expiration date: 01/05/2020   Route: Intravitreal, Site: Right Eye, Waste: 0 mL  Post-op Post injection exam found visual acuity of at least counting fingers. The patient tolerated the procedure well. There were no complications. The patient received written and verbal post procedure care education.              Pt reports no change in vision OU; still sees a shape resembling an "ameoba" OD.   ASSESSMENT/PLAN:    ICD-10-CM   1. 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 Exudative age-related macular degeneration of right eye with  active choroidal neovascularization (HCC)  H35.3211 Intravitreal Injection, Pharmacologic Agent - OD - Right Eye    Bevacizumab (AVASTIN) SOLN 1.25 mg  3. Central serous chorioretinopathy of right eye  H35.711 Intravitreal Injection, Pharmacologic Agent - OD - Right Eye    Bevacizumab (AVASTIN) SOLN 1.25 mg  4. Retinal edema  H35.81 OCT, Retina - OU - Both Eyes  5. Essential hypertension  I10   6. Hypertensive retinopathy of both eyes  H35.033   7. 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 Hypertensive retinopathy OU  - discussed importance of tight BP control  - monitor  7.  Mixed cataracts OU  - The symptoms of cataract, surgical options, and treatments and risks were discussed with patient.  - discussed diagnosis and progression  - not yet visually significant  - monitor for now   Ophthalmic Meds Ordered this visit:  Meds ordered this encounter  Medications  . Bevacizumab (AVASTIN) SOLN 1.25 mg      Return in about 4 weeks (around 11/04/2019) for 4 wk f/u w/DFE & OCT OU - Poss. inj. OD.  There are no  Patient Instructions on file for this visit.   Explained the diagnoses, plan, and follow up with the patient and they expressed understanding.  Patient expressed understanding of the importance of proper follow up care.   This document serves as a record of services personally  performed by Karie Chimera, MD, PhD. It was created on their behalf by Laurian Brim, OA, an ophthalmic assistant. The creation of this record is the provider's dictation and/or activities during the visit.    Electronically signed by: Laurian Brim, OA 04.09.2021 4:26 PM  Karie Chimera, M.D., Ph.D. Diseases & Surgery of the Retina and Vitreous [facility]  I have reviewed the above documentation for accuracy and completeness, and I agree with the above. Karie Chimera, M.D., Ph.D. 10/07/19 4:26 PM   Abbreviations: M myopia (nearsighted); A astigmatism; H hyperopia (farsighted); P presbyopia; Mrx spectacle prescription;  CTL contact lenses; OD right eye; OS left eye; OU both eyes  XT exotropia; ET esotropia; PEK punctate epithelial keratitis; PEE punctate epithelial erosions; DES dry eye syndrome; MGD meibomian gland dysfunction; ATs artificial tears; PFAT's preservative free artificial tears; [place] nuclear sclerotic cataract; PSC posterior subcapsular cataract; ERM epi-retinal membrane; PVD posterior vitreous detachment; RD retinal detachment; DM diabetes mellitus; DR diabetic retinopathy; NPDR non-proliferative diabetic retinopathy; PDR proliferative diabetic retinopathy; CSME clinically significant macular edema; DME diabetic macular edema; dbh dot blot hemorrhages; CWS cotton wool spot; POAG primary open angle glaucoma; C/D cup-to-disc ratio; HVF humphrey visual field; GVF goldmann visual field; OCT optical coherence tomography; IOP intraocular pressure; BRVO Branch retinal vein occlusion; CRVO central retinal vein occlusion; CRAO central retinal artery occlusion; BRAO branch retinal artery occlusion; RT retinal tear; SB scleral buckle; PPV pars plana vitrectomy; VH Vitreous hemorrhage; PRP panretinal laser photocoagulation; IVK intravitreal kenalog; VMT vitreomacular traction; MH Macular hole;  NVD neovascularization of the disc; NVE neovascularization elsewhere; AREDS age  related eye disease study; ARMD age related macular degeneration; POAG primary open angle glaucoma; EBMD epithelial/anterior basement membrane dystrophy; ACIOL anterior chamber intraocular lens; IOL intraocular lens; PCIOL posterior chamber intraocular lens; Phaco/IOL phacoemulsification with intraocular lens placement; PRK photorefractive keratectomy; LASIK laser assisted in situ keratomileusis; HTN hypertension; DM diabetes mellitus; COPD chronic obstructive pulmonary disease

## 2019-10-07 ENCOUNTER — Ambulatory Visit (INDEPENDENT_AMBULATORY_CARE_PROVIDER_SITE_OTHER): Payer: BLUE CROSS/BLUE SHIELD | Admitting: Ophthalmology

## 2019-10-07 ENCOUNTER — Encounter (INDEPENDENT_AMBULATORY_CARE_PROVIDER_SITE_OTHER): Payer: Self-pay | Admitting: Ophthalmology

## 2019-10-07 DIAGNOSIS — H3581 Retinal edema: Secondary | ICD-10-CM | POA: Diagnosis not present

## 2019-10-07 DIAGNOSIS — H35051 Retinal neovascularization, unspecified, right eye: Secondary | ICD-10-CM

## 2019-10-07 DIAGNOSIS — H35711 Central serous chorioretinopathy, right eye: Secondary | ICD-10-CM | POA: Diagnosis not present

## 2019-10-07 DIAGNOSIS — H353211 Exudative age-related macular degeneration, right eye, with active choroidal neovascularization: Secondary | ICD-10-CM | POA: Diagnosis not present

## 2019-10-07 DIAGNOSIS — H25813 Combined forms of age-related cataract, bilateral: Secondary | ICD-10-CM

## 2019-10-07 DIAGNOSIS — I10 Essential (primary) hypertension: Secondary | ICD-10-CM

## 2019-10-07 DIAGNOSIS — H35033 Hypertensive retinopathy, bilateral: Secondary | ICD-10-CM

## 2019-10-07 MED ORDER — BEVACIZUMAB CHEMO INJECTION 1.25MG/0.05ML SYRINGE FOR KALEIDOSCOPE
1.2500 mg | INTRAVITREAL | Status: AC | PRN
  Administered 2019-10-07: 1.25 mg via INTRAVITREAL

## 2019-10-09 IMAGING — CR DG ANKLE COMPLETE 3+V*R*
3 series · 3 of 3 positions shown · non-contrast
Comparison: None.

CLINICAL DATA: Pain following fall

EXAM:
RIGHT ANKLE - COMPLETE 3+ VIEW

[ankle ap]
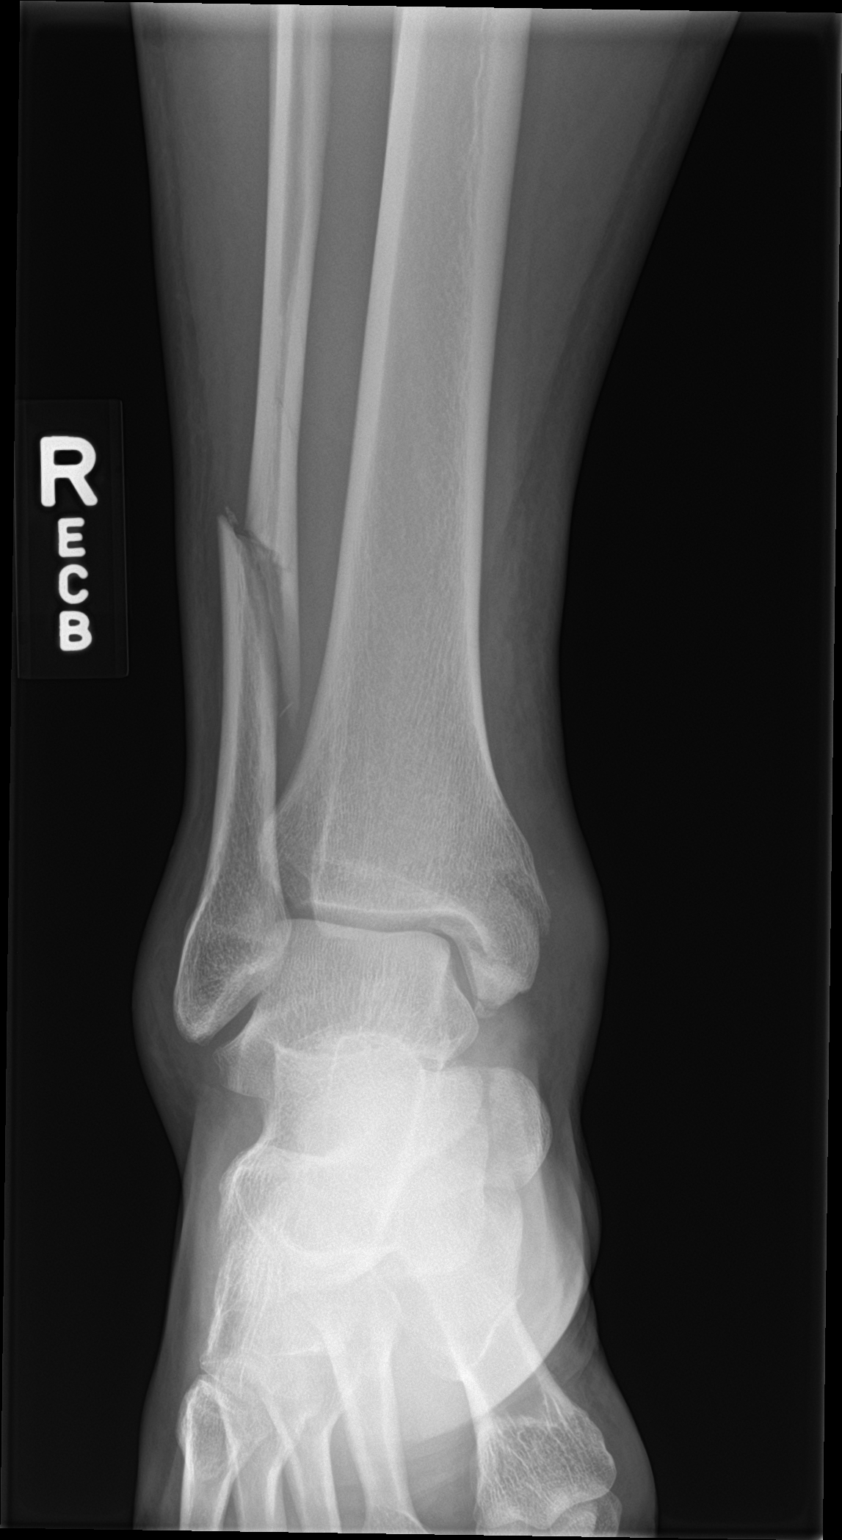

[ankle obl]
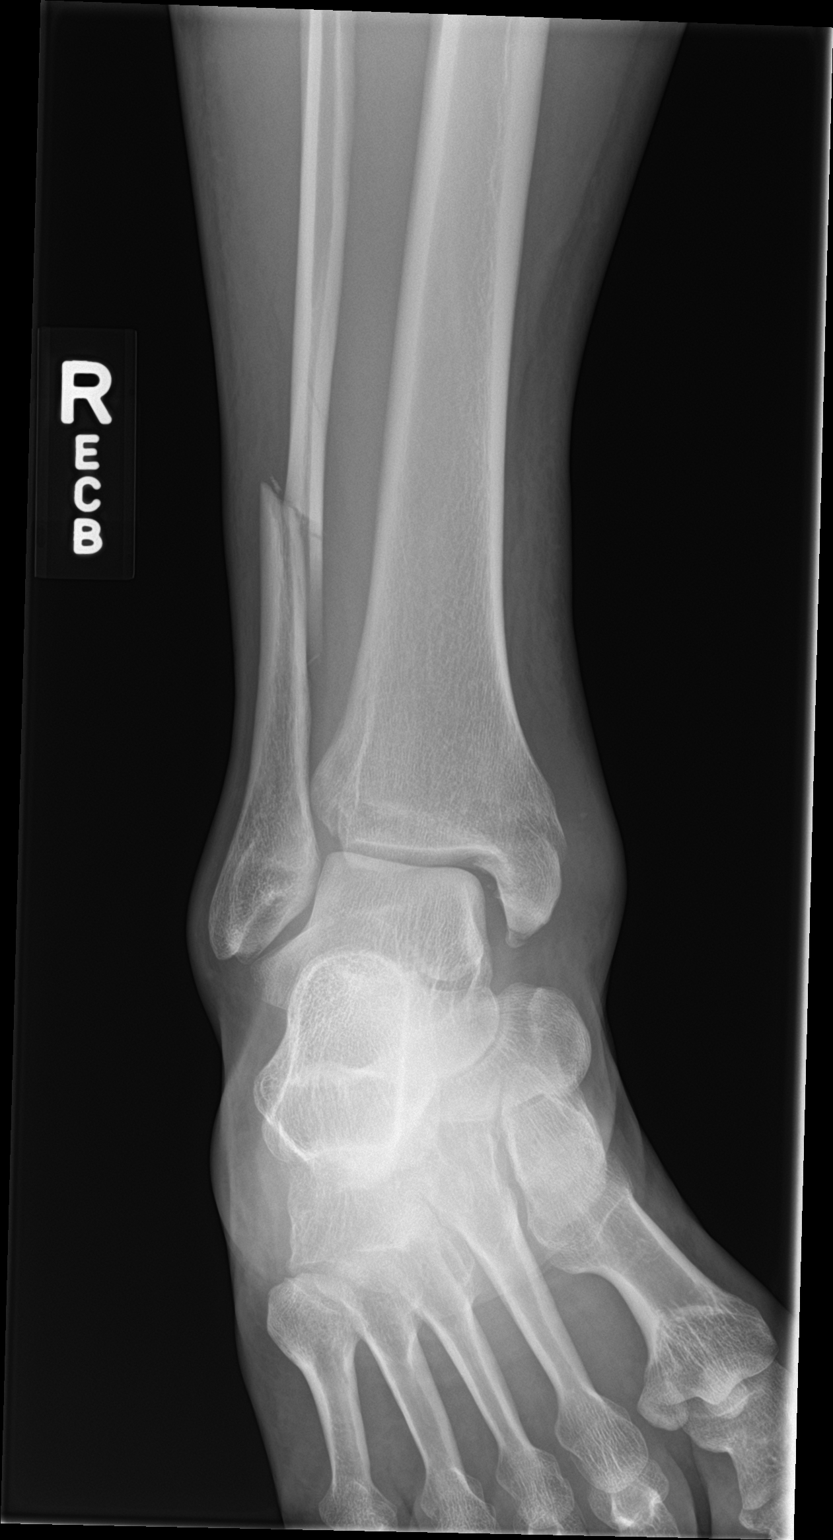

[ankle lat]
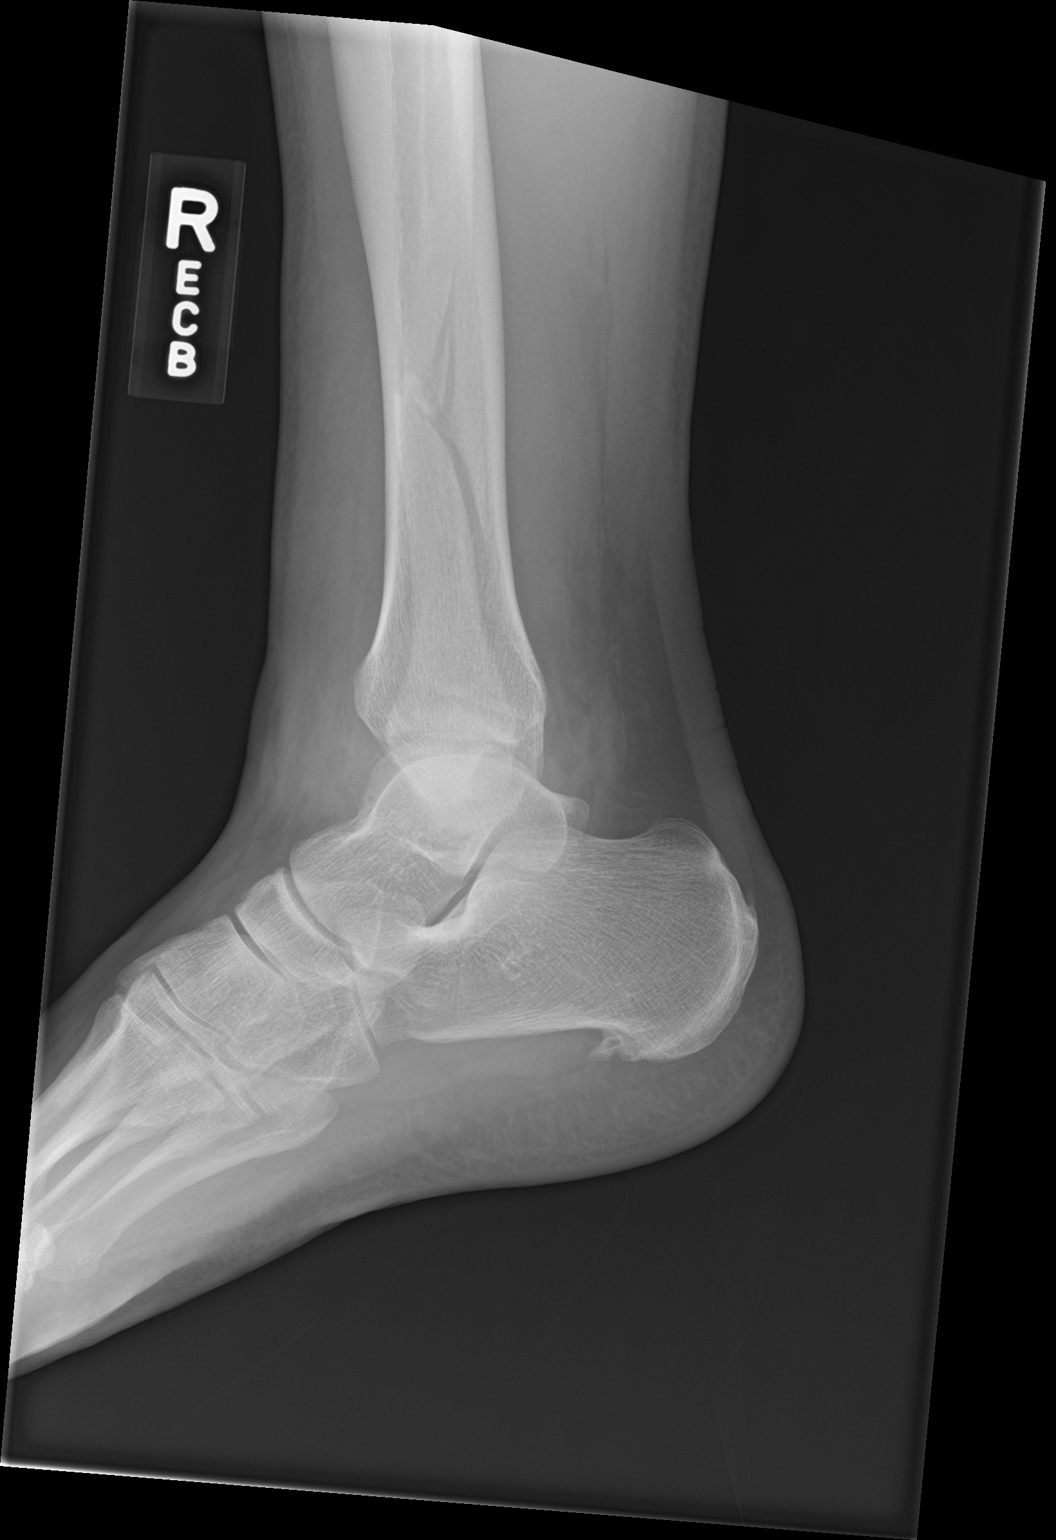

[3 of 3 positions shown; findings below may reference images not displayed]

FINDINGS: Frontal, oblique, and lateral views were obtained. There is
generalized soft tissue swelling. There is a comminuted fracture of
the distal fibular diaphysis with lateral displacement of the distal
major fracture fragment with respect to the major proximal fragment.
There is an incomplete fracture of the superior aspect of the medial
malleolus. More distally, there is an avulsion from the medial
malleolus. There is a joint effusion.

No other fractures are evident. Ankle mortise appears grossly
intact. There is a spur arising from the inferior calcaneus. There
is a smaller spur arising from the posterior calcaneus.
IMPRESSION: 1. Displaced fracture distal fibular diaphysis at a level
approximately 6 cm proximal to the tibial plafond.

2. Incomplete fracture along the medial superior aspect of the
medial malleolus. There is a small avulsion as well arising from the
inferior aspect of the medial malleolus.

3.  Diffuse soft tissue swelling as well as joint effusion.

4.  Ankle mortise grossly intact.

## 2019-10-14 DIAGNOSIS — F4323 Adjustment disorder with mixed anxiety and depressed mood: Secondary | ICD-10-CM | POA: Diagnosis not present

## 2019-11-01 NOTE — Progress Notes (Signed)
[facility] Note  11/04/2019     CHIEF COMPLAINT Patient presents for Retina Follow Up   HISTORY OF PRESENT ILLNESS: Tristan Thomas is a 49 y.o. male who presents to the clinic today for:   HPI    Retina Follow Up    Patient presents with  Other.  In right eye.  This started months ago.  Severity is mild.  Duration of 4 weeks.  Since onset it is gradually improving.  I, the attending physician,  performed the HPI with the patient and updated documentation appropriately.          Comments    49 y/o male pt here for 4 wk f/u for CNV v CSR OD.  VA OD improved s/p IVA OD; no longer sees "central halo" OD.  No change in [place] OS.  Denies pain, FOL, floaters.  No gtts.       Last edited by Bernarda Caffey, MD on 11/08/2019 12:09 AM. (History)    Patient states halo in right eye is smaller since last visit.  Referring physician: No referring provider defined for this encounter.  HISTORICAL INFORMATION:   Selected notes from the MEDICAL RECORD NUMBER Referred by Dr. Martinique DeMarco for concern of worsening PED   CURRENT MEDICATIONS: No current outpatient medications on file. (Ophthalmic Drugs)   No current facility-administered medications for this visit. (Ophthalmic Drugs)   Current Outpatient Medications (Other)  Medication Sig  . buPROPion (WELLBUTRIN) 75 MG tablet Take 75 mg by mouth 2 (two) times daily.  . busPIRone (BUSPAR) 5 MG tablet Take 5 mg by mouth 3 (three) times daily.  . Cetirizine HCl 10 MG CAPS Take 1 capsule (10 mg total) by mouth daily for 10 days.  . fluticasone (FLONASE) 50 MCG/ACT nasal spray Place 1 spray into both nostrils daily as needed for allergies or rhinitis.  Marland Kitchen ibuprofen (ADVIL,MOTRIN) 600 MG tablet Take 1 tablet (600 mg total) by mouth every 6 (six) hours as needed.  . loratadine (CLARITIN) 10 MG tablet Take 10 mg by mouth daily.  Marland Kitchen omeprazole (PRILOSEC) 20 MG capsule Take 20 mg by mouth 3 times/day as needed-between  meals & bedtime.   Marland Kitchen oxyCODONE-acetaminophen (PERCOCET) 5-325 MG tablet Take 1 tablet by mouth every 4 (four) hours as needed. (Patient taking differently: Take 1 tablet by mouth every 4 (four) hours as needed. )  . oxyCODONE-acetaminophen (ROXICET) 5-325 MG tablet Take 1 tablet by mouth every 4 (four) hours as needed for severe pain.  . SUMAtriptan (IMITREX) 100 MG tablet Take 100 mg by mouth every 2 (two) hours as needed for migraine. May repeat in 2 hours if headache persists or recurs.  . tamsulosin (FLOMAX) 0.4 MG CAPS capsule tamsulosin 0.4 mg capsule  . venlafaxine XR (EFFEXOR-XR) 150 MG 24 hr capsule Take 150 mg by mouth daily.   No current facility-administered medications for this visit. (Other)      REVIEW OF SYSTEMS: ROS    Positive for: Gastrointestinal, Musculoskeletal, Eyes   Negative for: Constitutional, Neurological, Skin, Genitourinary, HENT, Endocrine, Cardiovascular, Respiratory, Psychiatric, Allergic/Imm, Heme/Lymph   Last edited by Matthew Folks, COA on 11/04/2019  9:40 AM. (History)       ALLERGIES Allergies  Allergen Reactions  . Hydrocodone-Acetaminophen Other (See Comments)    Pt got cold-chills     PAST MEDICAL HISTORY Past Medical History:  Diagnosis Date  . Anxiety   . Arthritis   . Asthma    not diagnosed  .  Cataract    Mixed OU  . GERD (gastroesophageal reflux disease)   . Hypertensive retinopathy    OU   Past Surgical History:  Procedure Laterality Date  . ORIF ANKLE FRACTURE Right 04/15/2017   Procedure: OPEN REDUCTION INTERNAL FIXATION (ORIF) RIGHT ANKLE AND SYNDESMOSIS;  Surgeon: Nadara Mustard, MD;  Location: [facility] OR;  Service: Orthopedics;  Laterality: Right;  . TOOTH EXTRACTION     9 removed    FAMILY HISTORY Family History  Problem Relation Age of Onset  . Heart disease Father   . Stroke Mother   . Hypertension Mother   . Hyperlipidemia Mother     SOCIAL HISTORY Social History   Tobacco Use  . Smoking status: Former  Smoker    Years: 15.00  . Smokeless tobacco: Never Used  . Tobacco comment: quit 1998 ish  Substance Use Topics  . Alcohol use: Yes    Comment: monthy maybe  . Drug use: No         OPHTHALMIC EXAM:  Base Eye Exam    Visual Acuity (Snellen - Linear)      Right Left   Dist cc 20/15 -2 20/20   Correction: Glasses       Tonometry (Tonopen, 9:42 AM)      Right Left   Pressure 13 14       Pupils      Dark Light Shape React APD   Right 2 1 Round Brisk None   Left 2 1 Round Brisk None       Visual Fields (Counting fingers)      Left Right    Full Full       Extraocular Movement      Right Left    Full, Ortho Full, Ortho       Neuro/Psych    Oriented x3: Yes   Mood/Affect: Normal       Dilation    Both eyes: 1.0% Mydriacyl, 2.5% Phenylephrine @ 9:42 AM        Slit Lamp and Fundus Exam    Slit Lamp Exam      Right Left   Lids/Lashes mild MGD  mild MGD   Conjunctiva/Sclera White and quiet White and quiet   Cornea trace PEE trace PEE   Anterior Chamber deep and clear, narrow temporal angle deep and clear, narrow temporal angle   Iris round and dilated round and dilated   Lens 1 + Nucleus sclerois, 1+ cortical 1 + Nucleus sclerois, 1+ cortical   Vitreous mild syneresis mild syneresis       Fundus Exam      Right Left   Disc compact, pink and sharp compact, pink and sharp   C/D Ratio 0.1 0.1   Macula Good foveal reflex, focal CNVM/PED with interval resolution of focal surrounding SRF--nasal to fovea flat, good foveal reflex, mild RPE mottling, no heme or edema   Vessels mild Vascular attenuation,  mild Tortuous, mild copper wiring mild Vascular attenuation,  mild Tortuous, mild copper wiring, mild A/V crossing changes   Periphery attached, no heme attached, no heme          IMAGING AND PROCEDURES  Imaging and Procedures for @TODAY @  OCT, Retina - OU - Both Eyes       Right Eye Quality was good. Central Foveal Thickness: 272. Progression has  improved. Findings include normal foveal contour, no IRF, no SRF, outer retinal atrophy, vitreomacular adhesion  (Focal SRF nasal fovea--interval resolution, overlying PED; CSR).  Left Eye Quality was good. Central Foveal Thickness: 289. Progression has been stable. Findings include normal foveal contour, no IRF, no SRF, vitreomacular adhesion .   Notes *Images captured and stored on drive  Diagnosis / Impression:  OD: interval resolution of focal SRF nasal fovea, overlying PED/CNVM OS: NFP, no IRF/SRF, VMA  Clinical management:  See below  Abbreviations: NFP - Normal foveal profile. CME - cystoid macular edema. PED - pigment epithelial detachment. IRF - intraretinal fluid. SRF - subretinal fluid. EZ - ellipsoid zone. ERM - epiretinal membrane. ORA - outer retinal atrophy. ORT - outer retinal tubulation. SRHM - subretinal hyper-reflective material        Intravitreal Injection, Pharmacologic Agent - OD - Right Eye       Time Out 11/04/2019. 9:33 AM. Confirmed correct patient, procedure, site, and patient consented.   Anesthesia Topical anesthesia was used. Anesthetic medications included Lidocaine 2%, Proparacaine 0.5%.   Procedure Preparation included 5% betadine to ocular surface, eyelid speculum. A supplied needle was used.   Injection:  1.25 mg Bevacizumab (AVASTIN) SOLN   NDC: 49702-637-85, Lot: [id number] , Expiration date: 01/04/2020   Route: Intravitreal, Site: Right Eye, Waste: 0 mL  Post-op Post injection exam found visual acuity of at least counting fingers. The patient tolerated the procedure well. There were no complications. The patient received written and verbal post procedure care education.              Pt reports no change in vision OU; still sees a shape resembling an "ameoba" OD.   ASSESSMENT/PLAN:    ICD-10-CM   1. Choroidal neovascular membrane of right eye  H35.051 Intravitreal Injection, Pharmacologic Agent - OD - Right Eye    Bevacizumab  (AVASTIN) SOLN 1.25 mg  2. Exudative age-related macular degeneration of right eye with active choroidal neovascularization (HCC)  H35.3211 Intravitreal Injection, Pharmacologic Agent - OD - Right Eye    Bevacizumab (AVASTIN) SOLN 1.25 mg  3. 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 Retinal edema  H35.81 OCT, Retina - OU - Both Eyes  5. Essential hypertension  I10   6. Hypertensive retinopathy of both eyes  H35.033   7. 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 Hypertensive retinopathy OU  - discussed importance of tight BP control  - monitor  7.  Mixed cataracts OU  - The symptoms of cataract, surgical options, and treatments and risks were discussed with patient.  - discussed diagnosis and progression  - not yet visually significant  - monitor for now   Ophthalmic Meds Ordered this visit:  Meds ordered this encounter  Medications  . Bevacizumab (AVASTIN) SOLN 1.25 mg      Return in about 4 weeks (around 12/02/2019) for 4 weeks CNV OD, DFE, OCT.  There are no Patient Instructions on file for this  visit.   Explained the diagnoses, plan, and follow up with the patient and they expressed understanding.  Patient expressed understanding of the importance of proper follow up care.   This document serves as a record of services personally performed by Karie Chimera, MD, PhD. It was  created on their behalf by Laurian Brim, OA, an ophthalmic assistant. The creation of this record is the provider's dictation and/or activities during the visit.    Electronically signed by: Laurian Brim, OA 05.11.2021 12:17 AM  Karie Chimera, M.D., Ph.D. Diseases & Surgery of the Retina and Vitreous [facility]  I have reviewed the above documentation for accuracy and completeness, and I agree with the above. Karie Chimera, M.D., Ph.D. 11/08/19 12:17 AM   Abbreviations: M myopia (nearsighted); A astigmatism; H hyperopia (farsighted); P presbyopia; Mrx spectacle prescription;  CTL contact lenses; OD right eye; OS left eye; OU both eyes  XT exotropia; ET esotropia; PEK punctate epithelial keratitis; PEE punctate epithelial erosions; DES dry eye syndrome; MGD meibomian gland dysfunction; ATs artificial tears; PFAT's preservative free artificial tears; NSC nuclear sclerotic cataract; PSC posterior subcapsular cataract; ERM epi-retinal membrane; PVD posterior vitreous detachment; RD retinal detachment; DM diabetes mellitus; DR diabetic retinopathy; NPDR non-proliferative diabetic retinopathy; PDR proliferative diabetic retinopathy; CSME clinically significant macular edema; DME diabetic macular edema; dbh dot blot hemorrhages; CWS cotton wool spot; POAG primary open angle glaucoma; C/D cup-to-disc ratio; HVF humphrey visual field; GVF goldmann visual field; OCT optical coherence tomography; IOP intraocular pressure; BRVO Branch retinal vein occlusion; CRVO central retinal vein occlusion; CRAO central retinal artery occlusion; BRAO branch retinal artery occlusion; RT retinal tear; SB scleral buckle; PPV pars plana vitrectomy; VH Vitreous hemorrhage; PRP panretinal laser photocoagulation; IVK intravitreal kenalog; VMT vitreomacular traction; MH Macular hole;  NVD neovascularization of the disc; NVE neovascularization elsewhere; AREDS age related eye disease study; ARMD age related  macular degeneration; POAG primary open angle glaucoma; EBMD epithelial/anterior basement membrane dystrophy; ACIOL anterior chamber intraocular lens; IOL intraocular lens; PCIOL posterior chamber intraocular lens; Phaco/IOL phacoemulsification with intraocular lens placement; PRK photorefractive keratectomy; LASIK laser assisted in situ keratomileusis; HTN hypertension; DM diabetes mellitus; COPD chronic obstructive pulmonary disease

## 2019-11-04 ENCOUNTER — Encounter (INDEPENDENT_AMBULATORY_CARE_PROVIDER_SITE_OTHER): Payer: Self-pay | Admitting: Ophthalmology

## 2019-11-04 ENCOUNTER — Other Ambulatory Visit: Payer: Self-pay

## 2019-11-04 ENCOUNTER — Ambulatory Visit (INDEPENDENT_AMBULATORY_CARE_PROVIDER_SITE_OTHER): Payer: BLUE CROSS/BLUE SHIELD | Admitting: Ophthalmology

## 2019-11-04 DIAGNOSIS — H35051 Retinal neovascularization, unspecified, right eye: Secondary | ICD-10-CM | POA: Diagnosis not present

## 2019-11-04 DIAGNOSIS — H3581 Retinal edema: Secondary | ICD-10-CM

## 2019-11-04 DIAGNOSIS — H353211 Exudative age-related macular degeneration, right eye, with active choroidal neovascularization: Secondary | ICD-10-CM

## 2019-11-04 DIAGNOSIS — H35033 Hypertensive retinopathy, bilateral: Secondary | ICD-10-CM

## 2019-11-04 DIAGNOSIS — H35711 Central serous chorioretinopathy, right eye: Secondary | ICD-10-CM

## 2019-11-04 DIAGNOSIS — H25813 Combined forms of age-related cataract, bilateral: Secondary | ICD-10-CM

## 2019-11-04 DIAGNOSIS — I10 Essential (primary) hypertension: Secondary | ICD-10-CM

## 2019-11-08 ENCOUNTER — Encounter (INDEPENDENT_AMBULATORY_CARE_PROVIDER_SITE_OTHER): Payer: Self-pay | Admitting: Ophthalmology

## 2019-11-08 MED ORDER — BEVACIZUMAB CHEMO INJECTION 1.25MG/0.05ML SYRINGE FOR KALEIDOSCOPE
1.2500 mg | INTRAVITREAL | Status: AC | PRN
  Administered 2019-11-08: 1.25 mg via INTRAVITREAL

## 2019-11-17 DIAGNOSIS — Z720 Tobacco use: Secondary | ICD-10-CM | POA: Diagnosis not present

## 2019-11-17 DIAGNOSIS — F900 Attention-deficit hyperactivity disorder, predominantly inattentive type: Secondary | ICD-10-CM | POA: Diagnosis not present

## 2019-11-17 DIAGNOSIS — F331 Major depressive disorder, recurrent, moderate: Secondary | ICD-10-CM | POA: Diagnosis not present

## 2019-11-17 DIAGNOSIS — F411 Generalized anxiety disorder: Secondary | ICD-10-CM | POA: Diagnosis not present

## 2019-11-17 DIAGNOSIS — F439 Reaction to severe stress, unspecified: Secondary | ICD-10-CM | POA: Diagnosis not present

## 2019-11-24 DIAGNOSIS — F4323 Adjustment disorder with mixed anxiety and depressed mood: Secondary | ICD-10-CM | POA: Diagnosis not present

## 2019-12-06 NOTE — Progress Notes (Signed)
[facility] - Clinic Note  12/07/2019     CHIEF COMPLAINT Patient presents for Retina Follow Up   HISTORY OF PRESENT ILLNESS: Tristan Thomas is a 49 y.o. male who presents to the clinic today for:   HPI    Retina Follow Up    I, the attending physician,  performed the HPI with the patient and updated documentation appropriately.          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  No change in [place] OU.  Denies pain, FOL, floaters.  No gtts.       Last edited by Rennis Chris, MD on 12/07/2019  9:28 AM. (History)    Patient states halo in right eye is smaller since last visit.  Referring physician: No referring provider defined for this encounter.  HISTORICAL INFORMATION:   Selected notes from the MEDICAL RECORD NUMBER Referred by Dr. [place] DeMarco for concern of worsening PED   CURRENT MEDICATIONS: No current outpatient medications on file. (Ophthalmic Drugs)   No current facility-administered medications for this visit. (Ophthalmic Drugs)   Current Outpatient Medications (Other)  Medication Sig  . buPROPion (WELLBUTRIN) 75 MG tablet Take 75 mg by mouth 2 (two) times daily.  . busPIRone (BUSPAR) 5 MG tablet Take 5 mg by mouth 3 (three) times daily.  . fluticasone (FLONASE) 50 MCG/ACT nasal spray Place 1 spray into both nostrils daily as needed for allergies or rhinitis.  Marland Kitchen ibuprofen (ADVIL,MOTRIN) 600 MG tablet Take 1 tablet (600 mg total) by mouth every 6 (six) hours as needed.  . loratadine (CLARITIN) 10 MG tablet Take 10 mg by mouth daily.  Marland Kitchen omeprazole (PRILOSEC) 20 MG capsule Take 20 mg by mouth 3 times/day as needed-between meals & bedtime.   Marland Kitchen oxyCODONE-acetaminophen (PERCOCET) 5-325 MG tablet Take 1 tablet by mouth every 4 (four) hours as needed. (Patient taking differently: Take 1 tablet by mouth every 4 (four) hours as needed. )  . oxyCODONE-acetaminophen (ROXICET) 5-325 MG tablet Take 1 tablet by mouth every 4  (four) hours as needed for severe pain.  . SUMAtriptan (IMITREX) 100 MG tablet Take 100 mg by mouth every 2 (two) hours as needed for migraine. May repeat in 2 hours if headache persists or recurs.  . tamsulosin (FLOMAX) 0.4 MG CAPS capsule tamsulosin 0.4 mg capsule  . venlafaxine XR (EFFEXOR-XR) 150 MG 24 hr capsule Take 150 mg by mouth daily.  . Cetirizine HCl 10 MG CAPS Take 1 capsule (10 mg total) by mouth daily for 10 days.   No current facility-administered medications for this visit. (Other)      REVIEW OF SYSTEMS: ROS    Positive for: Gastrointestinal, Musculoskeletal, Eyes   Negative for: Constitutional, Neurological, Skin, Genitourinary, HENT, Endocrine, Cardiovascular, Respiratory, Psychiatric, Allergic/Imm, Heme/Lymph   Last edited by Celine Mans, COA on 12/07/2019  8:47 AM. (History)       ALLERGIES Allergies  Allergen Reactions  . Hydrocodone-Acetaminophen Other (See Comments)    Pt got cold-chills     PAST MEDICAL HISTORY Past Medical History:  Diagnosis Date  . Anxiety   . Arthritis   . Asthma    not diagnosed  . Cataract    Mixed OU  . GERD (gastroesophageal reflux disease)   . Hypertensive retinopathy    OU   Past Surgical History:  Procedure Laterality Date  . ORIF ANKLE FRACTURE Right 04/15/2017   Procedure: OPEN REDUCTION INTERNAL  FIXATION (ORIF) RIGHT ANKLE AND SYNDESMOSIS;  Surgeon: Newt Minion, MD;  Location: [facility];  Service: Orthopedics;  Laterality: Right;  . TOOTH EXTRACTION     9 removed    FAMILY HISTORY Family History  Problem Relation Age of Onset  . Heart disease Father   . Stroke Mother   . Hypertension Mother   . Hyperlipidemia Mother     SOCIAL HISTORY Social History   Tobacco Use  . Smoking status: Former Smoker    Years: 15.00  . Smokeless tobacco: Never Used  . Tobacco comment: quit 1998 ish  Vaping Use  . Vaping Use: Never used  Substance Use Topics  . Alcohol use: Yes    Comment: monthy maybe  . Drug  use: No         OPHTHALMIC EXAM:  Base Eye Exam    Visual Acuity (Snellen - Linear)      Right Left   Dist cc 20/15 -2 20/20   Correction: Glasses       Tonometry (Tonopen, 8:49 AM)      Right Left   Pressure 16 15       Pupils      Dark Light Shape React APD   Right 2 1 Round Brisk None   Left 2 1 Round Brisk None       Visual Fields (Counting fingers)      Left Right    Full Full       Extraocular Movement      Right Left    Full, Ortho Full, Ortho       Neuro/Psych    Oriented x3: Yes   Mood/Affect: Normal       Dilation    Both eyes: 1.0% Mydriacyl, 2.5% Phenylephrine @ 8:49 AM        Slit Lamp and Fundus Exam    Slit Lamp Exam      Right Left   Lids/Lashes mild MGD  mild MGD   Conjunctiva/Sclera White and quiet White and quiet   Cornea trace PEE, mild Debris in tear film trace PEE   Anterior Chamber deep and clear, narrow temporal angle deep and clear, narrow temporal angle   Iris round and dilated round and dilated   Lens 1 + Nucleus sclerois, 1+ cortical 1 + Nucleus sclerois, 1+ cortical   Vitreous mild syneresis mild syneresis       Fundus Exam      Right Left   Disc compact, pink and sharp compact, pink and sharp   C/D Ratio 0.1 0.1   Macula Good foveal reflex, focal CNVM/PED with resolution of focal surrounding SRF -- nasal to fovea, No heme or edema flat, good foveal reflex, mild RPE mottling, no heme or edema   Vessels mild Vascular attenuation,  mild Tortuous, mild copper wiring mild Vascular attenuation,  mild Tortuous, mild copper wiring, mild A/V crossing changes   Periphery attached, no heme attached, no heme          IMAGING AND PROCEDURES  Imaging and Procedures for @TODAY @  OCT, Retina - OU - Both Eyes       Right Eye Quality was good. Central Foveal Thickness: 268. Progression has improved. Findings include normal foveal contour, no IRF, no SRF, outer retinal atrophy, vitreomacular adhesion , choroidal neovascular  membrane (Persistent, focal SRHM/PED/CNVM with interval improvement in overlying edema).   Left Eye Quality was good. Central Foveal Thickness: 289. Progression has been stable. Findings include normal foveal contour, no IRF,  no SRF, vitreomacular adhesion .   Notes *Images captured and stored on drive  Diagnosis / Impression:  OD: Persistent, focal SRHM/PED/CNVM with interval improvement in overlying edema OS: NFP, no IRF/SRF, VMA  Clinical management:  See below  Abbreviations: NFP - Normal foveal profile. CME - cystoid macular edema. PED - pigment epithelial detachment. IRF - intraretinal fluid. SRF - subretinal fluid. EZ - ellipsoid zone. ERM - epiretinal membrane. ORA - outer retinal atrophy. ORT - outer retinal tubulation. SRHM - subretinal hyper-reflective material        Intravitreal Injection, Pharmacologic Agent - OD - Right Eye       Time Out 12/07/2019. 9:44 AM. Confirmed correct patient, procedure, site, and patient consented.   Anesthesia Topical anesthesia was used. Anesthetic medications included Lidocaine 2%, Proparacaine 0.5%.   Procedure Preparation included 5% betadine to ocular surface, eyelid speculum. A supplied needle was used.   Injection:  1.25 mg Bevacizumab (AVASTIN) SOLN   NDC: 57846-962-95, Lot: [id number] , Expiration date: 02/15/2020   Route: Intravitreal, Site: Right Eye, Waste: 0 mg  Post-op Post injection exam found visual acuity of at least counting fingers. The patient tolerated the procedure well. There were no complications. The patient received written and verbal post procedure care education.                 ASSESSMENT/PLAN:    ICD-10-CM   1. 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 Exudative age-related macular degeneration of right eye with active choroidal neovascularization (HCC)  H35.3211 Intravitreal  Injection, Pharmacologic Agent - OD - Right Eye    Bevacizumab (AVASTIN) SOLN 1.25 mg  3. 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 Retinal edema  H35.81 OCT, Retina - OU - Both Eyes  5. Essential hypertension  I10   6. Hypertensive retinopathy of both eyes  H35.033   7. 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  - s/p IVA OD #1 (04.16.21), #2 (05.14.21)  - FA (03.26.21) shows focal CNV with late leakage nasal to fovea corresponding to OCT  - BCVA 20/15 OD today from 20/25  - pt reports stable improvement in metamorphopsia  - OCT shows persistent, focal SRHM/PED/CNVM with interval improvement in overlying edema  - Recommend IVA OD #3 today, 06.16.21  - RBA of procedure discussed, questions answered  - informed consent obtained  - IVA consent form signed and scanned on 4.16.21   - see procedure note  - f/u in 5 weeks -- DFE, OCT, possible injection  5,6. Hypertensive retinopathy OU  - discussed importance of tight BP control  - monitor  7.  Mixed cataracts OU  - The symptoms of cataract, surgical options, and treatments and risks were discussed with patient.  - discussed diagnosis and progression  - not yet visually significant  - monitor for now   Ophthalmic Meds Ordered this visit:  Meds ordered this encounter  Medications  . Bevacizumab (AVASTIN) SOLN 1.25 mg      Return in about 5 weeks (around 01/11/2020) for f/u CNV OD, DFE, OCT.  There are no Patient Instructions on file for this visit.   Explained the diagnoses, plan, and follow up with the patient and they expressed understanding.  Patient expressed understanding of the importance of proper follow up care.   This document serves as  a record of services personally performed by Karie Chimera, MD, PhD. It was created on their behalf by Annalee Genta, COMT. The creation of this record is the provider's dictation and/or activities during the  visit.  Electronically signed by: Annalee Genta, COMT 12/07/19 12:45 PM  Karie Chimera, M.D., Ph.D. Diseases & Surgery of the Retina and Vitreous [facility] 12/07/2019   I have reviewed the above documentation for accuracy and completeness, and I agree with the above. Karie Chimera, M.D., Ph.D. 12/07/19 12:45 PM   Abbreviations: M myopia (nearsighted); A astigmatism; H hyperopia (farsighted); P presbyopia; Mrx spectacle prescription;  CTL contact lenses; OD right eye; OS left eye; OU both eyes  XT exotropia; ET esotropia; PEK punctate epithelial keratitis; PEE punctate epithelial erosions; DES dry eye syndrome; MGD meibomian gland dysfunction; ATs artificial tears; PFAT's preservative free artificial tears; NSC nuclear sclerotic cataract; PSC posterior subcapsular cataract; ERM epi-retinal membrane; PVD posterior vitreous detachment; RD retinal detachment; DM diabetes mellitus; DR diabetic retinopathy; NPDR non-proliferative diabetic retinopathy; PDR proliferative diabetic retinopathy; CSME clinically significant macular edema; DME diabetic macular edema; dbh dot blot hemorrhages; CWS cotton wool spot; POAG primary open angle glaucoma; C/D cup-to-disc ratio; HVF humphrey visual field; GVF goldmann visual field; OCT optical coherence tomography; IOP intraocular pressure; BRVO Branch retinal vein occlusion; CRVO central retinal vein occlusion; CRAO central retinal artery occlusion; BRAO branch retinal artery occlusion; RT retinal tear; SB scleral buckle; PPV pars plana vitrectomy; VH Vitreous hemorrhage; PRP panretinal laser photocoagulation; IVK intravitreal kenalog; VMT vitreomacular traction; MH Macular hole;  NVD neovascularization of the disc; NVE neovascularization elsewhere; AREDS age related eye disease study; ARMD age related macular degeneration; POAG primary open angle glaucoma; EBMD epithelial/anterior basement membrane dystrophy; ACIOL anterior chamber intraocular  lens; IOL intraocular lens; PCIOL posterior chamber intraocular lens; Phaco/IOL phacoemulsification with intraocular lens placement; PRK photorefractive keratectomy; LASIK laser assisted in situ keratomileusis; HTN hypertension; DM diabetes mellitus; COPD chronic obstructive pulmonary disease

## 2019-12-07 ENCOUNTER — Other Ambulatory Visit: Payer: Self-pay

## 2019-12-07 ENCOUNTER — Encounter (INDEPENDENT_AMBULATORY_CARE_PROVIDER_SITE_OTHER): Payer: Self-pay | Admitting: Ophthalmology

## 2019-12-07 ENCOUNTER — Ambulatory Visit (INDEPENDENT_AMBULATORY_CARE_PROVIDER_SITE_OTHER): Payer: BLUE CROSS/BLUE SHIELD | Admitting: Ophthalmology

## 2019-12-07 DIAGNOSIS — H353211 Exudative age-related macular degeneration, right eye, with active choroidal neovascularization: Secondary | ICD-10-CM | POA: Diagnosis not present

## 2019-12-07 DIAGNOSIS — H35711 Central serous chorioretinopathy, right eye: Secondary | ICD-10-CM

## 2019-12-07 DIAGNOSIS — H3581 Retinal edema: Secondary | ICD-10-CM | POA: Diagnosis not present

## 2019-12-07 DIAGNOSIS — H35051 Retinal neovascularization, unspecified, right eye: Secondary | ICD-10-CM

## 2019-12-07 DIAGNOSIS — I10 Essential (primary) hypertension: Secondary | ICD-10-CM

## 2019-12-07 DIAGNOSIS — H25813 Combined forms of age-related cataract, bilateral: Secondary | ICD-10-CM

## 2019-12-07 DIAGNOSIS — H35033 Hypertensive retinopathy, bilateral: Secondary | ICD-10-CM

## 2019-12-07 MED ORDER — BEVACIZUMAB CHEMO INJECTION 1.25MG/0.05ML SYRINGE FOR KALEIDOSCOPE
1.2500 mg | INTRAVITREAL | Status: AC | PRN
  Administered 2019-12-07: 1.25 mg via INTRAVITREAL

## 2019-12-09 ENCOUNTER — Encounter (INDEPENDENT_AMBULATORY_CARE_PROVIDER_SITE_OTHER): Payer: BLUE CROSS/BLUE SHIELD | Admitting: Ophthalmology

## 2019-12-29 DIAGNOSIS — F4323 Adjustment disorder with mixed anxiety and depressed mood: Secondary | ICD-10-CM | POA: Diagnosis not present

## 2020-01-02 ENCOUNTER — Encounter (INDEPENDENT_AMBULATORY_CARE_PROVIDER_SITE_OTHER): Payer: BLUE CROSS/BLUE SHIELD | Admitting: Ophthalmology

## 2020-01-09 NOTE — Progress Notes (Signed)
[facility] - Clinic Note  01/13/2020     CHIEF COMPLAINT Patient presents for Retina Follow Up   HISTORY OF PRESENT ILLNESS: Tristan Thomas is a 49 y.o. male who presents to the clinic today for:   HPI    Retina Follow Up    Patient presents with  Other.  In right eye.  Duration of 5 weeks.  Since onset it is stable.  I, the attending physician,  performed the HPI with the patient and updated documentation appropriately.          Comments    5 week follow up CNV OD- Vision appears stable OU.  Denies using eye drops.       Last edited by Rennis ChrisZamora, Haydan Mansouri, MD on 01/13/2020 11:30 AM. (History)    Patient states he has not noticed any change in vision since last exam  Referring physician: No referring provider defined for this encounter.  HISTORICAL INFORMATION:   Selected notes from the MEDICAL RECORD NUMBER Referred by Dr. [place] DeMarco for concern of worsening PED   CURRENT MEDICATIONS: No current outpatient medications on file. (Ophthalmic Drugs)   No current facility-administered medications for this visit. (Ophthalmic Drugs)   Current Outpatient Medications (Other)  Medication Sig  . buPROPion (WELLBUTRIN) 75 MG tablet Take 75 mg by mouth 2 (two) times daily.  . busPIRone (BUSPAR) 5 MG tablet Take 5 mg by mouth 3 (three) times daily.  . Cetirizine HCl 10 MG CAPS Take 1 capsule (10 mg total) by mouth daily for 10 days.  . fluticasone (FLONASE) 50 MCG/ACT nasal spray Place 1 spray into both nostrils daily as needed for allergies or rhinitis.  Marland Kitchen. ibuprofen (ADVIL,MOTRIN) 600 MG tablet Take 1 tablet (600 mg total) by mouth every 6 (six) hours as needed.  . loratadine (CLARITIN) 10 MG tablet Take 10 mg by mouth daily.  Marland Kitchen. omeprazole (PRILOSEC) 20 MG capsule Take 20 mg by mouth 3 times/day as needed-between meals & bedtime.   Marland Kitchen. oxyCODONE-acetaminophen (PERCOCET) 5-325 MG tablet Take 1 tablet by mouth every 4 (four) hours as needed. (Patient taking  differently: Take 1 tablet by mouth every 4 (four) hours as needed. )  . oxyCODONE-acetaminophen (ROXICET) 5-325 MG tablet Take 1 tablet by mouth every 4 (four) hours as needed for severe pain.  . SUMAtriptan (IMITREX) 100 MG tablet Take 100 mg by mouth every 2 (two) hours as needed for migraine. May repeat in 2 hours if headache persists or recurs.  . tamsulosin (FLOMAX) 0.4 MG CAPS capsule tamsulosin 0.4 mg capsule  . venlafaxine XR (EFFEXOR-XR) 150 MG 24 hr capsule Take 150 mg by mouth daily.   No current facility-administered medications for this visit. (Other)      REVIEW OF SYSTEMS: ROS    Positive for: Gastrointestinal, Musculoskeletal, Eyes, Respiratory, Psychiatric   Negative for: Constitutional, Neurological, Skin, Genitourinary, HENT, Endocrine, Cardiovascular, Allergic/Imm, Heme/Lymph   Last edited by Joni ReiningHodges, Robin, COA on 01/13/2020  9:19 AM. (History)       ALLERGIES Allergies  Allergen Reactions  . Hydrocodone-Acetaminophen Other (See Comments)    Pt got cold-chills     PAST MEDICAL HISTORY Past Medical History:  Diagnosis Date  . Anxiety   . Arthritis   . Asthma    not diagnosed  . Cataract    Mixed OU  . GERD (gastroesophageal reflux disease)   . Hypertensive retinopathy    OU   Past Surgical History:  Procedure Laterality Date  . ORIF ANKLE  FRACTURE Right 04/15/2017   Procedure: OPEN REDUCTION INTERNAL FIXATION (ORIF) RIGHT ANKLE AND SYNDESMOSIS;  Surgeon: Nadara Mustard, MD;  [facility];  Service: Orthopedics;  Laterality: Right;  . TOOTH EXTRACTION     9 removed    FAMILY HISTORY Family History  Problem Relation Age of Onset  . Heart disease Father   . Stroke Mother   . Hypertension Mother   . Hyperlipidemia Mother     SOCIAL HISTORY Social History   Tobacco Use  . Smoking status: Former Smoker    Years: 15.00  . Smokeless tobacco: Never Used  . Tobacco comment: quit 1998 ish  Vaping Use  . Vaping Use: Never used  Substance  Use Topics  . Alcohol use: Yes    Comment: monthy maybe  . Drug use: No         OPHTHALMIC EXAM:  Base Eye Exam    Visual Acuity (Snellen - Linear)      Right Left   Dist cc 20/20 20/20       Tonometry (Tonopen, 9:22 AM)      Right Left   Pressure 16 14       Pupils      Dark Light Shape React APD   Right 2 1 Round Brisk None   Left 2 1 Round Brisk None       Visual Fields (Counting fingers)      Left Right    Full Full       Extraocular Movement      Right Left    Full Full       Neuro/Psych    Oriented x3: Yes   Mood/Affect: Normal       Dilation    Both eyes: 1.0% Mydriacyl, 2.5% Phenylephrine @ 9:23 AM        Slit Lamp and Fundus Exam    Slit Lamp Exam      Right Left   Lids/Lashes mild MGD  mild MGD   Conjunctiva/Sclera White and quiet White and quiet   Cornea trace PEE, mild Debris in tear film trace PEE   Anterior Chamber deep and clear, narrow temporal angle deep and clear, narrow temporal angle   Iris round and dilated round and dilated   Lens 1 + Nucleus sclerois, 1+ cortical 1 + Nucleus sclerois, 1+ cortical   Vitreous mild syneresis mild syneresis       Fundus Exam      Right Left   Disc compact, pink and sharp compact, pink and sharp   C/D Ratio 0.1 0.1   Macula Good foveal reflex, focal CNVM/PED with resolution of focal surrounding SRF -- nasal to fovea, trace cystic changes overlying CNV, No heme or edema flat, good foveal reflex, mild RPE mottling, no heme or edema   Vessels mild Vascular attenuation,  mild Tortuous, mild copper wiring mild Vascular attenuation,  mild Tortuous, mild copper wiring, mild A/V crossing changes   Periphery attached, no heme attached, no heme          IMAGING AND PROCEDURES  Imaging and Procedures for @TODAY @  OCT, Retina - OU - Both Eyes       Right Eye Quality was good. Central Foveal Thickness: 271. Progression has been stable. Findings include normal foveal contour, no IRF, no SRF, outer  retinal atrophy, vitreomacular adhesion , choroidal neovascular membrane (Persistent, ?increased IRF).   Left Eye Quality was good. Central Foveal Thickness: 280. Progression has been stable. Findings include normal foveal contour,  no IRF, no SRF, vitreomacular adhesion .   Notes *Images captured and stored on drive  Diagnosis / Impression:  OD: Persistent, ?increased IRF OS: NFP, no IRF/SRF, VMA  Clinical management:  See below  Abbreviations: NFP - Normal foveal profile. CME - cystoid macular edema. PED - pigment epithelial detachment. IRF - intraretinal fluid. SRF - subretinal fluid. EZ - ellipsoid zone. ERM - epiretinal membrane. ORA - outer retinal atrophy. ORT - outer retinal tubulation. SRHM - subretinal hyper-reflective material        Intravitreal Injection, Pharmacologic Agent - OD - Right Eye       Time Out 01/13/2020. 10:15 AM. Confirmed correct patient, procedure, site, and patient consented.   Anesthesia Topical anesthesia was used. Anesthetic medications included Lidocaine 2%, Proparacaine 0.5%.   Procedure Preparation included 5% betadine to ocular surface, eyelid speculum. A supplied needle was used.   Injection:  1.25 mg Bevacizumab (AVASTIN) SOLN   NDC: 28366-294-76, Lot: [id number] , Expiration date: 02/22/2020   Route: Intravitreal, Site: Right Eye, Waste: 0 mL  Post-op Post injection exam found visual acuity of at least counting fingers. The patient tolerated the procedure well. There were no complications. The patient received written and verbal post procedure care education.                 ASSESSMENT/PLAN:    ICD-10-CM   1. 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 Exudative age-related macular degeneration of right eye with active choroidal neovascularization (HCC)  H35.3211 Intravitreal Injection, Pharmacologic Agent - OD -  Right Eye    Bevacizumab (AVASTIN) SOLN 1.25 mg  3. Central serous chorioretinopathy of right eye  H35.711 Intravitreal Injection, Pharmacologic Agent - OD - Right Eye    Bevacizumab (AVASTIN) SOLN 1.25 mg  4. Retinal edema  H35.81 OCT, Retina - OU - Both Eyes  5. Essential hypertension  I10   6. Hypertensive retinopathy of both eyes  H35.033   7. 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  - s/p IVA OD #1 (04.16.21), #2 (05.14.21), #3 (06.16.21)  - FA (03.26.21) shows focal CNV with late leakage nasal to fovea corresponding to OCT  - BCVA 20/20 OD   - pt reports stable improvement in metamorphopsia  - OCT shows persistent, ?increased IRF  - Recommend IVA OD #4 today, 07.23.21  - RBA of procedure discussed, questions answered  - informed consent obtained  - IVA consent form signed and scanned on 4.16.21   - see procedure note  - f/u in 5 weeks -- DFE, OCT, possible injection  5,6. Hypertensive retinopathy OU  - discussed importance of tight BP control  - monitor  7.  Mixed cataracts OU  - The symptoms of cataract, surgical options, and treatments and risks were discussed with patient.  - discussed diagnosis and progression  - not yet visually significant  - monitor for now   Ophthalmic Meds Ordered this visit:  Meds ordered this encounter  Medications  . Bevacizumab (AVASTIN) SOLN 1.25 mg      Return in about 5 weeks (around 02/17/2020) for f/u CNV OD, DFE, OCT.  There are no Patient Instructions on file for this visit.   Explained the diagnoses, plan, and follow up with the patient and they expressed understanding.  Patient expressed understanding of the importance of proper follow up care.  This document serves as a record of services personally performed by Karie Chimera, MD, PhD. It was created on their behalf by Glee Arvin. Manson Passey, OA an ophthalmic technician. The creation of this record is the provider's dictation  and/or activities during the visit.    Electronically signed by: Glee Arvin. Kristopher Oppenheim 07.19.2021 11:47 PM  Karie Chimera, M.D., Ph.D. Diseases & Surgery of the Retina and Vitreous [facility]  I have reviewed the above documentation for accuracy and completeness, and I agree with the above. Karie Chimera, M.D., Ph.D. 01/15/20 11:47 PM   Abbreviations: M myopia (nearsighted); A astigmatism; H hyperopia (farsighted); P presbyopia; Mrx spectacle prescription;  CTL contact lenses; OD right eye; OS left eye; OU both eyes  XT exotropia; ET esotropia; PEK punctate epithelial keratitis; PEE punctate epithelial erosions; DES dry eye syndrome; MGD meibomian gland dysfunction; ATs artificial tears; PFAT's preservative free artificial tears; NSC nuclear sclerotic cataract; PSC posterior subcapsular cataract; ERM epi-retinal membrane; PVD posterior vitreous detachment; RD retinal detachment; DM diabetes mellitus; DR diabetic retinopathy; NPDR non-proliferative diabetic retinopathy; PDR proliferative diabetic retinopathy; CSME clinically significant macular edema; DME diabetic macular edema; dbh dot blot hemorrhages; CWS cotton wool spot; POAG primary open angle glaucoma; C/D cup-to-disc ratio; HVF humphrey visual field; GVF goldmann visual field; OCT optical coherence tomography; IOP intraocular pressure; BRVO Branch retinal vein occlusion; CRVO central retinal vein occlusion; CRAO central retinal artery occlusion; BRAO branch retinal artery occlusion; RT retinal tear; SB scleral buckle; PPV pars plana vitrectomy; VH Vitreous hemorrhage; PRP panretinal laser photocoagulation; IVK intravitreal kenalog; VMT vitreomacular traction; MH Macular hole;  NVD neovascularization of the disc; NVE neovascularization elsewhere; AREDS age related eye disease study; ARMD age related macular degeneration; POAG primary open angle glaucoma; EBMD epithelial/anterior basement membrane dystrophy; ACIOL anterior  chamber intraocular lens; IOL intraocular lens; PCIOL posterior chamber intraocular lens; Phaco/IOL phacoemulsification with intraocular lens placement; PRK photorefractive keratectomy; LASIK laser assisted in situ keratomileusis; HTN hypertension; DM diabetes mellitus; COPD chronic obstructive pulmonary disease

## 2020-01-13 ENCOUNTER — Ambulatory Visit (INDEPENDENT_AMBULATORY_CARE_PROVIDER_SITE_OTHER): Payer: BLUE CROSS/BLUE SHIELD | Admitting: Ophthalmology

## 2020-01-13 ENCOUNTER — Other Ambulatory Visit: Payer: Self-pay

## 2020-01-13 ENCOUNTER — Encounter (INDEPENDENT_AMBULATORY_CARE_PROVIDER_SITE_OTHER): Payer: Self-pay | Admitting: Ophthalmology

## 2020-01-13 DIAGNOSIS — H35711 Central serous chorioretinopathy, right eye: Secondary | ICD-10-CM | POA: Diagnosis not present

## 2020-01-13 DIAGNOSIS — H35051 Retinal neovascularization, unspecified, right eye: Secondary | ICD-10-CM | POA: Diagnosis not present

## 2020-01-13 DIAGNOSIS — H25813 Combined forms of age-related cataract, bilateral: Secondary | ICD-10-CM

## 2020-01-13 DIAGNOSIS — I10 Essential (primary) hypertension: Secondary | ICD-10-CM

## 2020-01-13 DIAGNOSIS — H3581 Retinal edema: Secondary | ICD-10-CM | POA: Diagnosis not present

## 2020-01-13 DIAGNOSIS — H353211 Exudative age-related macular degeneration, right eye, with active choroidal neovascularization: Secondary | ICD-10-CM

## 2020-01-13 DIAGNOSIS — H35033 Hypertensive retinopathy, bilateral: Secondary | ICD-10-CM

## 2020-01-15 MED ORDER — BEVACIZUMAB CHEMO INJECTION 1.25MG/0.05ML SYRINGE FOR KALEIDOSCOPE
1.2500 mg | INTRAVITREAL | Status: AC | PRN
  Administered 2020-01-15: 1.25 mg via INTRAVITREAL

## 2020-02-03 NOTE — Progress Notes (Signed)
[facility] - Clinic Note  02/10/2020     CHIEF COMPLAINT Patient presents for Retina Follow Up   HISTORY OF PRESENT ILLNESS: Tristan Thomas is a 49 y.o. male who presents to the clinic today for:   HPI    Patient here for 5 weeks retina follow up for CNV OD. Patient states vision doing alright. Has eye pain for allergies and sinus. Has an earache for the past month. Seeing dr.   Maurice Small edited by Laddie Aquas, COA on 02/10/2020  9:10 AM. (History)    Patient states he has not noticed any change in vision since last exam  Referring physician: No referring provider defined for this encounter.  HISTORICAL INFORMATION:   Selected notes from the MEDICAL RECORD NUMBER Referred by Dr. [place] DeMarco for concern of worsening PED   CURRENT MEDICATIONS: No current outpatient medications on file. (Ophthalmic Drugs)   No current facility-administered medications for this visit. (Ophthalmic Drugs)   Current Outpatient Medications (Other)  Medication Sig  . buPROPion (WELLBUTRIN) 75 MG tablet Take 75 mg by mouth 2 (two) times daily.  . busPIRone (BUSPAR) 5 MG tablet Take 5 mg by mouth 3 (three) times daily.  . Cetirizine HCl 10 MG CAPS Take 1 capsule (10 mg total) by mouth daily for 10 days.  . fluticasone (FLONASE) 50 MCG/ACT nasal spray Place 1 spray into both nostrils daily as needed for allergies or rhinitis.  Marland Kitchen ibuprofen (ADVIL,MOTRIN) 600 MG tablet Take 1 tablet (600 mg total) by mouth every 6 (six) hours as needed.  . loratadine (CLARITIN) 10 MG tablet Take 10 mg by mouth daily.  Marland Kitchen omeprazole (PRILOSEC) 20 MG capsule Take 20 mg by mouth 3 times/day as needed-between meals & bedtime.   Marland Kitchen oxyCODONE-acetaminophen (PERCOCET) 5-325 MG tablet Take 1 tablet by mouth every 4 (four) hours as needed. (Patient taking differently: Take 1 tablet by mouth every 4 (four) hours as needed. )  . oxyCODONE-acetaminophen (ROXICET) 5-325 MG tablet Take 1 tablet by mouth every 4  (four) hours as needed for severe pain.  . SUMAtriptan (IMITREX) 100 MG tablet Take 100 mg by mouth every 2 (two) hours as needed for migraine. May repeat in 2 hours if headache persists or recurs.  . tamsulosin (FLOMAX) 0.4 MG CAPS capsule tamsulosin 0.4 mg capsule  . venlafaxine XR (EFFEXOR-XR) 150 MG 24 hr capsule Take 150 mg by mouth daily.   No current facility-administered medications for this visit. (Other)      REVIEW OF SYSTEMS: ROS    Positive for: Gastrointestinal, Musculoskeletal, Eyes, Respiratory, Psychiatric   Negative for: Constitutional, Neurological, Skin, Genitourinary, HENT, Endocrine, Cardiovascular, Allergic/Imm, Heme/Lymph   Last edited by Laddie Aquas, COA on 02/10/2020  9:10 AM. (History)       ALLERGIES Allergies  Allergen Reactions  . Hydrocodone-Acetaminophen Other (See Comments)    Pt got cold-chills     PAST MEDICAL HISTORY Past Medical History:  Diagnosis Date  . Anxiety   . Arthritis   . Asthma    not diagnosed  . Cataract    Mixed OU  . GERD (gastroesophageal reflux disease)   . Hypertensive retinopathy    OU   Past Surgical History:  Procedure Laterality Date  . ORIF ANKLE FRACTURE Right 04/15/2017   Procedure: OPEN REDUCTION INTERNAL FIXATION (ORIF) RIGHT ANKLE AND SYNDESMOSIS;  Surgeon: Nadara Mustard, MD;  Location: [facility] OR;  Service: Orthopedics;  Laterality: Right;  . TOOTH EXTRACTION  9 removed    FAMILY HISTORY Family History  Problem Relation Age of Onset  . Heart disease Father   . Stroke Mother   . Hypertension Mother   . Hyperlipidemia Mother     SOCIAL HISTORY Social History   Tobacco Use  . Smoking status: Former Smoker    Years: 15.00  . Smokeless tobacco: Never Used  . Tobacco comment: quit 1998 ish  Vaping Use  . Vaping Use: Never used  Substance Use Topics  . Alcohol use: Yes    Comment: monthy maybe  . Drug use: No         OPHTHALMIC EXAM:  Base Eye Exam    Visual Acuity (Snellen -  Linear)      Right Left   Dist cc 20/20 20/20   Correction: Glasses       Tonometry (Tonopen, 9:07 AM)      Right Left   Pressure 12 8       Pupils      Dark Light Shape React APD   Right 3 2 Round Brisk None   Left 3 2 Round Brisk None       Visual Fields (Counting fingers)      Left Right    Full Full       Extraocular Movement      Right Left    Full, Ortho Full, Ortho       Neuro/Psych    Oriented x3: Yes   Mood/Affect: Normal       Dilation    Both eyes: 1.0% Mydriacyl, 2.5% Phenylephrine @ 9:07 AM        Slit Lamp and Fundus Exam    Slit Lamp Exam      Right Left   Lids/Lashes mild MGD  mild MGD   Conjunctiva/Sclera White and quiet White and quiet   Cornea trace PEE, mild Debris in tear film trace PEE   Anterior Chamber deep and clear, narrow temporal angle deep and clear, narrow temporal angle   Iris round and dilated round and dilated   Lens 1 + Nucleus sclerois, 1+ cortical 1 + Nucleus sclerois, 1+ cortical   Vitreous mild syneresis mild syneresis       Fundus Exam      Right Left   Disc compact, pink and sharp compact, pink and sharp   C/D Ratio 0.1 0.1   Macula Good foveal reflex, focal CNVM/PED with resolution of focal surrounding SRF -- nasal to fovea, trace cystic changes overlying CNV, No heme or edema flat, good foveal reflex, mild RPE mottling, no heme or edema   Vessels mild Vascular attenuation,  mild Tortuous, mild copper wiring mild Vascular attenuation,  mild Tortuous, mild copper wiring, mild A/V crossing changes   Periphery attached, no heme attached, no heme        Refraction    Wearing Rx      Sphere Cylinder Axis   Right +2.50 +0.25 140   Left +1.50 +0.25 065          IMAGING AND PROCEDURES  Imaging and Procedures for @TODAY @  OCT, Retina - OU - Both Eyes       Right Eye Quality was good. Central Foveal Thickness: 274. Progression has improved. Findings include normal foveal contour, no IRF, no SRF, outer retinal  atrophy, vitreomacular adhesion , choroidal neovascular membrane (Interval improvement in cystic changes overlying CNVM nasal to fovea).   Left Eye Quality was good. Central Foveal Thickness: 290. Progression has been  stable. Findings include normal foveal contour, no IRF, no SRF, vitreomacular adhesion .   Notes *Images captured and stored on drive  Diagnosis / Impression:  OD: Interval improvement in cystic changes overlying CNVM nasal to fovea OS: NFP, no IRF/SRF, VMA  Clinical management:  See below  Abbreviations: NFP - Normal foveal profile. CME - cystoid macular edema. PED - pigment epithelial detachment. IRF - intraretinal fluid. SRF - subretinal fluid. EZ - ellipsoid zone. ERM - epiretinal membrane. ORA - outer retinal atrophy. ORT - outer retinal tubulation. SRHM - subretinal hyper-reflective material        Intravitreal Injection, Pharmacologic Agent - OD - Right Eye       Time Out 02/10/2020. 9:33 AM. Confirmed correct patient, procedure, site, and patient consented.   Anesthesia Topical anesthesia was used. Anesthetic medications included Lidocaine 2%, Proparacaine 0.5%.   Procedure Preparation included 5% betadine to ocular surface, eyelid speculum. A (32g) needle was used.   Injection:  1.25 mg Bevacizumab (AVASTIN) SOLN   NDC: 14481-856-31, Lot: [id number], Expiration date: 03/05/2020   Route: Intravitreal, Site: Right Eye, Waste: 0.05 mL  Post-op Post injection exam found visual acuity of at least counting fingers. The patient tolerated the procedure well. There were no complications. The patient received written and verbal post procedure care education. Post injection medications were not given.                 ASSESSMENT/PLAN:    ICD-10-CM   1. 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 Exudative age-related macular degeneration of right  eye with active choroidal neovascularization (HCC)  H35.3211 Intravitreal Injection, Pharmacologic Agent - OD - Right Eye    Bevacizumab (AVASTIN) SOLN 1.25 mg  3. 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 Retinal edema  H35.81 OCT, Retina - OU - Both Eyes  5. Essential hypertension  I10   6. Hypertensive retinopathy of both eyes  H35.033   7. 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  - s/p IVA OD #1 (04.16.21), #2 (05.14.21), #3 (06.16.21), #4 (07.23.21)  - FA (03.26.21) shows focal CNV with late leakage nasal to fovea corresponding to OCT  - BCVA 20/20 OD   - pt reports stable improvement in metamorphopsia  - OCT shows interval improvement in cystic changes overlying CNVM nasal to fovea  - Recommend IVA OD #5 today, 08.20.21  - RBA of procedure discussed, questions answered  - informed consent obtained  - IVA consent form signed and scanned on 4.16.21   - see procedure note  - f/u in 5 weeks -- DFE, OCT, possible injection  5,6. Hypertensive retinopathy OU  - discussed importance of tight BP control  - monitor  7.  Mixed cataracts OU  - The symptoms of cataract, surgical options, and treatments and risks were discussed with patient.  - discussed diagnosis and progression  - not yet visually significant  - monitor for now   Ophthalmic Meds Ordered this visit:  Meds ordered this encounter  Medications  . Bevacizumab (AVASTIN) SOLN 1.25 mg      Return in about 5 weeks (around 03/16/2020) for f/u CNV OD, DFE, OCT.  There are no Patient Instructions on file for this visit.   Explained the diagnoses, plan, and follow up with the patient and they expressed understanding.  Patient expressed understanding of the importance of proper follow up care.   This document serves as a record of services personally performed by Karie ChimeraBrian G. Keilin Gamboa, MD, PhD. It was created on their behalf by Glee ArvinAmanda J. Manson PasseyBrown, OA an ophthalmic  technician. The creation of this record is the provider's dictation and/or activities during the visit.    Electronically signed by: Glee ArvinAmanda J. Kristopher OppenheimBrown, OA 08.13.2021 12:35 PM  Karie ChimeraBrian G. Tasheba Henson, M.D., Ph.D. Diseases & Surgery of the Retina and Vitreous [facility]  I have reviewed the above documentation for accuracy and completeness, and I agree with the above. Karie ChimeraBrian G. Cydne Grahn, M.D., Ph.D. 02/10/20 12:35 PM   Abbreviations: M myopia (nearsighted); A astigmatism; H hyperopia (farsighted); P presbyopia; Mrx spectacle prescription;  CTL contact lenses; OD right eye; OS left eye; OU both eyes  XT exotropia; ET esotropia; PEK punctate epithelial keratitis; PEE punctate epithelial erosions; DES dry eye syndrome; MGD meibomian gland dysfunction; ATs artificial tears; PFAT's preservative free artificial tears; NSC nuclear sclerotic cataract; PSC posterior subcapsular cataract; ERM epi-retinal membrane; PVD posterior vitreous detachment; RD retinal detachment; DM diabetes mellitus; DR diabetic retinopathy; NPDR non-proliferative diabetic retinopathy; PDR proliferative diabetic retinopathy; CSME clinically significant macular edema; DME diabetic macular edema; dbh dot blot hemorrhages; CWS cotton wool spot; POAG primary open angle glaucoma; C/D cup-to-disc ratio; HVF humphrey visual field; GVF goldmann visual field; OCT optical coherence tomography; IOP intraocular pressure; BRVO Branch retinal vein occlusion; CRVO central retinal vein occlusion; CRAO central retinal artery occlusion; BRAO branch retinal artery occlusion; RT retinal tear; SB scleral buckle; PPV pars plana vitrectomy; VH Vitreous hemorrhage; PRP panretinal laser photocoagulation; IVK intravitreal kenalog; VMT vitreomacular traction; MH Macular hole;  NVD neovascularization of the disc; NVE neovascularization elsewhere; AREDS age related eye disease study; ARMD age related macular degeneration; POAG primary open angle glaucoma;  EBMD epithelial/anterior basement membrane dystrophy; ACIOL anterior chamber intraocular lens; IOL intraocular lens; PCIOL posterior chamber intraocular lens; Phaco/IOL phacoemulsification with intraocular lens placement; PRK photorefractive keratectomy; LASIK laser assisted in situ keratomileusis; HTN hypertension; DM diabetes mellitus; COPD chronic obstructive pulmonary disease

## 2020-02-10 ENCOUNTER — Other Ambulatory Visit: Payer: Self-pay

## 2020-02-10 ENCOUNTER — Ambulatory Visit (INDEPENDENT_AMBULATORY_CARE_PROVIDER_SITE_OTHER): Payer: BLUE CROSS/BLUE SHIELD | Admitting: Ophthalmology

## 2020-02-10 ENCOUNTER — Encounter (INDEPENDENT_AMBULATORY_CARE_PROVIDER_SITE_OTHER): Payer: Self-pay | Admitting: Ophthalmology

## 2020-02-10 DIAGNOSIS — H35033 Hypertensive retinopathy, bilateral: Secondary | ICD-10-CM

## 2020-02-10 DIAGNOSIS — H353211 Exudative age-related macular degeneration, right eye, with active choroidal neovascularization: Secondary | ICD-10-CM

## 2020-02-10 DIAGNOSIS — H35051 Retinal neovascularization, unspecified, right eye: Secondary | ICD-10-CM | POA: Diagnosis not present

## 2020-02-10 DIAGNOSIS — H3581 Retinal edema: Secondary | ICD-10-CM | POA: Diagnosis not present

## 2020-02-10 DIAGNOSIS — H25813 Combined forms of age-related cataract, bilateral: Secondary | ICD-10-CM

## 2020-02-10 DIAGNOSIS — I10 Essential (primary) hypertension: Secondary | ICD-10-CM

## 2020-02-10 DIAGNOSIS — H35711 Central serous chorioretinopathy, right eye: Secondary | ICD-10-CM | POA: Diagnosis not present

## 2020-02-10 MED ORDER — BEVACIZUMAB CHEMO INJECTION 1.25MG/0.05ML SYRINGE FOR KALEIDOSCOPE
1.2500 mg | INTRAVITREAL | Status: AC | PRN
  Administered 2020-02-10: 1.25 mg via INTRAVITREAL

## 2020-02-13 DIAGNOSIS — Z9109 Other allergy status, other than to drugs and biological substances: Secondary | ICD-10-CM | POA: Diagnosis not present

## 2020-02-13 DIAGNOSIS — H68012 Acute Eustachian salpingitis, left ear: Secondary | ICD-10-CM | POA: Diagnosis not present

## 2020-02-13 DIAGNOSIS — J3489 Other specified disorders of nose and nasal sinuses: Secondary | ICD-10-CM | POA: Diagnosis not present

## 2020-02-29 DIAGNOSIS — F439 Reaction to severe stress, unspecified: Secondary | ICD-10-CM | POA: Diagnosis not present

## 2020-02-29 DIAGNOSIS — F900 Attention-deficit hyperactivity disorder, predominantly inattentive type: Secondary | ICD-10-CM | POA: Diagnosis not present

## 2020-02-29 DIAGNOSIS — F331 Major depressive disorder, recurrent, moderate: Secondary | ICD-10-CM | POA: Diagnosis not present

## 2020-02-29 DIAGNOSIS — F411 Generalized anxiety disorder: Secondary | ICD-10-CM | POA: Diagnosis not present

## 2020-03-12 NOTE — Progress Notes (Signed)
[facility] Note  03/16/2020     CHIEF COMPLAINT Patient presents for Retina Follow Up   HISTORY OF PRESENT ILLNESS: Tristan Thomas is a 49 y.o. male who presents to the clinic today for:   HPI    Retina Follow Up    Patient presents with  Other.  In right eye.  This started weeks ago.  Severity is moderate.  Duration of weeks.  Since onset it is stable.  I, the attending physician,  performed the HPI with the patient and updated documentation appropriately.          Comments    Pt states vision is the same OU.  Patient denies eye pain or discomfort and denies any new or worsening floaters or fol OU.       Last edited by Bernarda Caffey, MD on 03/17/2020 11:40 PM. (History)    Patient states vision is doing well  Referring physician: No referring provider defined for this encounter.  HISTORICAL INFORMATION:   Selected notes from the MEDICAL RECORD NUMBER Referred by Dr. Martinique DeMarco for concern of worsening PED   CURRENT MEDICATIONS: No current outpatient medications on file. (Ophthalmic Drugs)   No current facility-administered medications for this visit. (Ophthalmic Drugs)   Current Outpatient Medications (Other)  Medication Sig  . buPROPion (WELLBUTRIN) 75 MG tablet Take 75 mg by mouth 2 (two) times daily.  . busPIRone (BUSPAR) 5 MG tablet Take 5 mg by mouth 3 (three) times daily.  . Cetirizine HCl 10 MG CAPS Take 1 capsule (10 mg total) by mouth daily for 10 days.  . fluticasone (FLONASE) 50 MCG/ACT nasal spray Place 1 spray into both nostrils daily as needed for allergies or rhinitis.  Marland Kitchen ibuprofen (ADVIL,MOTRIN) 600 MG tablet Take 1 tablet (600 mg total) by mouth every 6 (six) hours as needed.  . loratadine (CLARITIN) 10 MG tablet Take 10 mg by mouth daily.  Marland Kitchen omeprazole (PRILOSEC) 20 MG capsule Take 20 mg by mouth 3 times/day as needed-between meals & bedtime.   Marland Kitchen oxyCODONE-acetaminophen (PERCOCET) 5-325 MG tablet Take 1 tablet by  mouth every 4 (four) hours as needed. (Patient taking differently: Take 1 tablet by mouth every 4 (four) hours as needed. )  . oxyCODONE-acetaminophen (ROXICET) 5-325 MG tablet Take 1 tablet by mouth every 4 (four) hours as needed for severe pain.  . SUMAtriptan (IMITREX) 100 MG tablet Take 100 mg by mouth every 2 (two) hours as needed for migraine. May repeat in 2 hours if headache persists or recurs.  . tamsulosin (FLOMAX) 0.4 MG CAPS capsule tamsulosin 0.4 mg capsule  . venlafaxine XR (EFFEXOR-XR) 150 MG 24 hr capsule Take 150 mg by mouth daily.   No current facility-administered medications for this visit. (Other)      REVIEW OF SYSTEMS: ROS    Positive for: Gastrointestinal, Musculoskeletal, Eyes, Respiratory, Psychiatric   Negative for: Constitutional, Neurological, Skin, Genitourinary, HENT, Endocrine, Cardiovascular, Allergic/Imm, Heme/Lymph   Last edited by Doneen Poisson on 03/16/2020  9:19 AM. (History)       ALLERGIES Allergies  Allergen Reactions  . Hydrocodone-Acetaminophen Other (See Comments)    Pt got cold-chills     PAST MEDICAL HISTORY Past Medical History:  Diagnosis Date  . Anxiety   . Arthritis   . Asthma    not diagnosed  . Cataract    Mixed OU  . GERD (gastroesophageal reflux disease)   . Hypertensive retinopathy    OU   Past  Surgical History:  Procedure Laterality Date  . ORIF ANKLE FRACTURE Right 04/15/2017   Procedure: OPEN REDUCTION INTERNAL FIXATION (ORIF) RIGHT ANKLE AND SYNDESMOSIS;  Surgeon: Newt Minion, MD;  Location: [place];  Service: Orthopedics;  Laterality: Right;  . TOOTH EXTRACTION     9 removed    FAMILY HISTORY Family History  Problem Relation Age of Onset  . Heart disease Father   . Stroke Mother   . Hypertension Mother   . Hyperlipidemia Mother     SOCIAL HISTORY Social History   Tobacco Use  . Smoking status: Former Smoker    Years: 15.00  . Smokeless tobacco: Never Used  . Tobacco comment: quit 1998 ish   Vaping Use  . Vaping Use: Never used  Substance Use Topics  . Alcohol use: Yes    Comment: monthy maybe  . Drug use: No         OPHTHALMIC EXAM:  Base Eye Exam    Visual Acuity (Snellen - Linear)      Right Left   Dist cc 20/20 20/20 -1   Correction: Glasses       Tonometry (Tonopen, 9:21 AM)      Right Left   Pressure 14 12       Pupils      Dark Light Shape React APD   Right 3 2 Round Brisk 0   Left 3 2 Round Brisk 0       Visual Fields      Left Right    Full Full       Extraocular Movement      Right Left    Full Full       Neuro/Psych    Oriented x3: Yes   Mood/Affect: Normal       Dilation    Both eyes: 1.0% Mydriacyl, 2.5% Phenylephrine @ 9:21 AM        Slit Lamp and Fundus Exam    Slit Lamp Exam      Right Left   Lids/Lashes mild MGD  mild MGD   Conjunctiva/Sclera White and quiet White and quiet   Cornea trace PEE, mild Debris in tear film trace PEE   Anterior Chamber deep and clear, narrow temporal angle deep and clear, narrow temporal angle   Iris round and dilated round and dilated   Lens 1 + Nucleus sclerois, 1+ cortical 1 + Nucleus sclerois, 1+ cortical   Vitreous mild syneresis mild syneresis       Fundus Exam      Right Left   Disc compact, pink and sharp compact, pink and sharp   C/D Ratio 0.1 0.1   Macula Good foveal reflex, focal CNVM/PED with resolution of focal surrounding SRF -- nasal to fovea, trace cystic changes overlying CNV -- slighlty increased, focal duse IT mac, No heme or edema flat, good foveal reflex, mild RPE mottling, no heme or edema   Vessels mild Vascular attenuation,  mild Tortuous, mild copper wiring mild Vascular attenuation,  mild Tortuous, mild copper wiring, mild A/V crossing changes   Periphery attached, no heme attached, no heme        Refraction    Wearing Rx      Sphere Cylinder Axis   Right +2.50 +0.25 140   Left +1.50 +0.25 065          IMAGING AND PROCEDURES  Imaging and Procedures  for _0 @  OCT, Retina - OU - Both Eyes       Right  Eye Quality was good. Central Foveal Thickness: 275. Progression has worsened. Findings include normal foveal contour, no IRF, no SRF, outer retinal atrophy, vitreomacular adhesion , choroidal neovascular membrane (Persistent trace cystic changes overlying CNVM nasal to fovea and IRHM -- slightly worse).   Left Eye Quality was good. Central Foveal Thickness: 290. Progression has been stable. Findings include normal foveal contour, no IRF, no SRF, vitreomacular adhesion .   Notes *Images captured and stored on drive  Diagnosis / Impression:  OD: Persistent trace cystic changes overlying CNVM nasal to fovea and IRHM -- slightly worse OS: NFP, no IRF/SRF, VMA  Clinical management:  See below  Abbreviations: NFP - Normal foveal profile. CME - cystoid macular edema. PED - pigment epithelial detachment. IRF - intraretinal fluid. SRF - subretinal fluid. EZ - ellipsoid zone. ERM - epiretinal membrane. ORA - outer retinal atrophy. ORT - outer retinal tubulation. SRHM - subretinal hyper-reflective material        Intravitreal Injection, Pharmacologic Agent - OD - Right Eye       Time Out 03/16/2020. 9:19 AM. Confirmed correct patient, procedure, site, and patient consented.   Anesthesia Topical anesthesia was used. Anesthetic medications included Lidocaine 2%, Proparacaine 0.5%.   Procedure Preparation included 5% betadine to ocular surface, eyelid speculum. A (32g) needle was used.   Injection:  1.25 mg Bevacizumab (AVASTIN) SOLN   NDC: 19147-829-56, Lot: [id number], Expiration date: 04/29/2020   Route: Intravitreal, Site: Right Eye, Waste: 0.05 mL  Post-op Post injection exam found visual acuity of at least counting fingers. The patient tolerated the procedure well. There were no complications. The patient received written and verbal post procedure care education. Post injection medications were not given.                  ASSESSMENT/PLAN:    ICD-10-CM   1. 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 Exudative age-related macular degeneration of right eye with active choroidal neovascularization (HCC)  H35.3211 Intravitreal Injection, Pharmacologic Agent - OD - Right Eye    Bevacizumab (AVASTIN) SOLN 1.25 mg  3. 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 Retinal edema  H35.81 OCT, Retina - OU - Both Eyes  5. Essential hypertension  I10   6. Hypertensive retinopathy of both eyes  H35.033   7. 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  - s/p IVA OD #1 (04.16.21), #2 (05.14.21), #3 (06.16.21), #4 (07.23.21), #5 (08.20.21)  - FA (03.26.21) shows focal CNV with late leakage nasal to fovea corresponding to OCT  - BCVA 20/20 OD   - pt reports stable improvement in metamorphopsia  - OCT shows persistent trace cystic changes overlying CNVM nasal to fovea- slightly increased from prior (5 wk interval)  - Recommend IVA OD #6 today, 09.24.21  - RBA of procedure discussed, questions answered  - informed consent obtained  - IVA consent form signed and scanned on 04.16.21   - see procedure note  - [employer] benefits investigation started, 09.24.21  - f/u in 4 weeks -- DFE, OCT, possible injection  5,6. Hypertensive retinopathy OU  - discussed importance of tight BP control  - monitor  7.  Mixed cataracts OU  - The symptoms of cataract, surgical options, and treatments and risks were discussed with patient.  - discussed diagnosis and progression  -  not yet visually significant  - monitor for now   Ophthalmic Meds Ordered this visit:  Meds ordered this encounter  Medications  . Bevacizumab (AVASTIN) SOLN 1.25 mg      Return in about 4 weeks (around 04/13/2020) for f/u CNV OD, DFE, OCT.  There are no Patient Instructions on file for  this visit.   Explained the diagnoses, plan, and follow up with the patient and they expressed understanding.  Patient expressed understanding of the importance of proper follow up care.   This document serves as a record of services personally performed by Gardiner Sleeper, MD, PhD. It was created on their behalf by San Jetty. Owens Shark, OA an ophthalmic technician. The creation of this record is the provider's dictation and/or activities during the visit.    Electronically signed by: San Jetty. Owens Shark, [place] 09.20.2021 11:44 PM  Gardiner Sleeper, M.D., Ph.D. Diseases & Surgery of the Retina and Vitreous [facility]  I have reviewed the above documentation for accuracy and completeness, and I agree with the above. Gardiner Sleeper, M.D., Ph.D. 03/17/20 11:44 PM    Abbreviations: M myopia (nearsighted); A astigmatism; H hyperopia (farsighted); P presbyopia; Mrx spectacle prescription;  CTL contact lenses; OD right eye; OS left eye; OU both eyes  XT exotropia; ET esotropia; PEK punctate epithelial keratitis; PEE punctate epithelial erosions; DES dry eye syndrome; MGD meibomian gland dysfunction; ATs artificial tears; PFAT's preservative free artificial tears; [place] nuclear sclerotic cataract; PSC posterior subcapsular cataract; ERM epi-retinal membrane; PVD posterior vitreous detachment; RD retinal detachment; DM diabetes mellitus; DR diabetic retinopathy; NPDR non-proliferative diabetic retinopathy; PDR proliferative diabetic retinopathy; CSME clinically significant macular edema; DME diabetic macular edema; dbh dot blot hemorrhages; CWS cotton wool spot; POAG primary open angle glaucoma; C/D cup-to-disc ratio; HVF humphrey visual field; GVF goldmann visual field; OCT optical coherence tomography; IOP intraocular pressure; BRVO Branch retinal vein occlusion; CRVO central retinal vein occlusion; CRAO central retinal artery occlusion; BRAO branch retinal artery occlusion; RT retinal tear; SB  scleral buckle; PPV pars plana vitrectomy; VH Vitreous hemorrhage; PRP panretinal laser photocoagulation; IVK intravitreal kenalog; VMT vitreomacular traction; MH Macular hole;  NVD neovascularization of the disc; NVE neovascularization elsewhere; AREDS age related eye disease study; ARMD age related macular degeneration; POAG primary open angle glaucoma; EBMD epithelial/anterior basement membrane dystrophy; ACIOL anterior chamber intraocular lens; IOL intraocular lens; PCIOL posterior chamber intraocular lens; Phaco/IOL phacoemulsification with intraocular lens placement; [place] photorefractive keratectomy; LASIK laser assisted in situ keratomileusis; HTN hypertension; DM diabetes mellitus; COPD chronic obstructive pulmonary disease

## 2020-03-16 ENCOUNTER — Ambulatory Visit (INDEPENDENT_AMBULATORY_CARE_PROVIDER_SITE_OTHER): Payer: BLUE CROSS/BLUE SHIELD | Admitting: Ophthalmology

## 2020-03-16 ENCOUNTER — Other Ambulatory Visit: Payer: Self-pay

## 2020-03-16 DIAGNOSIS — H35711 Central serous chorioretinopathy, right eye: Secondary | ICD-10-CM

## 2020-03-16 DIAGNOSIS — H3581 Retinal edema: Secondary | ICD-10-CM

## 2020-03-16 DIAGNOSIS — H353211 Exudative age-related macular degeneration, right eye, with active choroidal neovascularization: Secondary | ICD-10-CM | POA: Diagnosis not present

## 2020-03-16 DIAGNOSIS — H35051 Retinal neovascularization, unspecified, right eye: Secondary | ICD-10-CM | POA: Diagnosis not present

## 2020-03-16 DIAGNOSIS — I10 Essential (primary) hypertension: Secondary | ICD-10-CM

## 2020-03-16 DIAGNOSIS — H35033 Hypertensive retinopathy, bilateral: Secondary | ICD-10-CM

## 2020-03-16 DIAGNOSIS — H25813 Combined forms of age-related cataract, bilateral: Secondary | ICD-10-CM

## 2020-03-17 ENCOUNTER — Encounter (INDEPENDENT_AMBULATORY_CARE_PROVIDER_SITE_OTHER): Payer: Self-pay | Admitting: Ophthalmology

## 2020-03-17 DIAGNOSIS — H353211 Exudative age-related macular degeneration, right eye, with active choroidal neovascularization: Secondary | ICD-10-CM | POA: Diagnosis not present

## 2020-03-17 DIAGNOSIS — H35051 Retinal neovascularization, unspecified, right eye: Secondary | ICD-10-CM | POA: Diagnosis not present

## 2020-03-17 MED ORDER — BEVACIZUMAB CHEMO INJECTION 1.25MG/0.05ML SYRINGE FOR KALEIDOSCOPE
1.2500 mg | INTRAVITREAL | Status: AC | PRN
  Administered 2020-03-17: 1.25 mg via INTRAVITREAL

## 2020-04-02 DIAGNOSIS — Z20822 Contact with and (suspected) exposure to covid-19: Secondary | ICD-10-CM | POA: Diagnosis not present

## 2020-04-06 DIAGNOSIS — E785 Hyperlipidemia, unspecified: Secondary | ICD-10-CM | POA: Diagnosis not present

## 2020-04-06 DIAGNOSIS — Z0000 Encounter for general adult medical examination without abnormal findings: Secondary | ICD-10-CM | POA: Diagnosis not present

## 2020-04-11 NOTE — Progress Notes (Signed)
[facility] Note  04/16/2020     CHIEF COMPLAINT Patient presents for Retina Follow Up   HISTORY OF PRESENT ILLNESS: Tristan Thomas is a 49 y.o. male who presents to the clinic today for:   HPI    Retina Follow Up    Patient presents with  Other.  In right eye.  This started 4 weeks ago.  I, the attending physician,  performed the HPI with the patient and updated documentation appropriately.          Comments    Patient here for 4 weeks retina follow up for CNV OD. Patient states vision doing fine. Has had some discomfort past 3 - 4 weeks - sinus related.       Last edited by Bernarda Caffey, MD on 04/16/2020  4:51 PM. (History)    Patient states vision is doing well  Referring physician: Janie Morning, DO [place],  [place] [address]  HISTORICAL INFORMATION:   Selected notes from the MEDICAL RECORD NUMBER Referred by Dr. Martinique DeMarco for concern of worsening PED   CURRENT MEDICATIONS: No current outpatient medications on file. (Ophthalmic Drugs)   No current facility-administered medications for this visit. (Ophthalmic Drugs)   Current Outpatient Medications (Other)  Medication Sig  . buPROPion (WELLBUTRIN) 75 MG tablet Take 75 mg by mouth 2 (two) times daily.  . busPIRone (BUSPAR) 5 MG tablet Take 5 mg by mouth 3 (three) times daily.  . Cetirizine HCl 10 MG CAPS Take 1 capsule (10 mg total) by mouth daily for 10 days.  . fluticasone (FLONASE) 50 MCG/ACT nasal spray Place 1 spray into both nostrils daily as needed for allergies or rhinitis.  Marland Kitchen ibuprofen (ADVIL,MOTRIN) 600 MG tablet Take 1 tablet (600 mg total) by mouth every 6 (six) hours as needed.  . loratadine (CLARITIN) 10 MG tablet Take 10 mg by mouth daily.  Marland Kitchen omeprazole (PRILOSEC) 20 MG capsule Take 20 mg by mouth 3 times/day as needed-between meals & bedtime.   Marland Kitchen oxyCODONE-acetaminophen (PERCOCET) 5-325 MG tablet Take 1 tablet by mouth every 4 (four)  hours as needed. (Patient taking differently: Take 1 tablet by mouth every 4 (four) hours as needed. )  . oxyCODONE-acetaminophen (ROXICET) 5-325 MG tablet Take 1 tablet by mouth every 4 (four) hours as needed for severe pain.  . SUMAtriptan (IMITREX) 100 MG tablet Take 100 mg by mouth every 2 (two) hours as needed for migraine. May repeat in 2 hours if headache persists or recurs.  . tamsulosin (FLOMAX) 0.4 MG CAPS capsule tamsulosin 0.4 mg capsule  . venlafaxine XR (EFFEXOR-XR) 150 MG 24 hr capsule Take 150 mg by mouth daily.   No current facility-administered medications for this visit. (Other)      REVIEW OF SYSTEMS: ROS    Positive for: Gastrointestinal, Musculoskeletal, Eyes, Respiratory, Psychiatric   Negative for: Constitutional, Neurological, Skin, Genitourinary, HENT, Endocrine, Cardiovascular, Allergic/Imm, Heme/Lymph   Last edited by Theodore Demark, COA on 04/16/2020  2:54 PM. (History)       ALLERGIES Allergies  Allergen Reactions  . Hydrocodone-Acetaminophen Other (See Comments)    Pt got cold-chills     PAST MEDICAL HISTORY Past Medical History:  Diagnosis Date  . Anxiety   . Arthritis   . Asthma    not diagnosed  . Cataract    Mixed OU  . GERD (gastroesophageal reflux disease)   . Hypertensive retinopathy    OU   Past Surgical History:  Procedure Laterality Date  . ORIF ANKLE FRACTURE Right 04/15/2017   Procedure: OPEN REDUCTION INTERNAL FIXATION (ORIF) RIGHT ANKLE AND SYNDESMOSIS;  Surgeon: Newt Minion, MD;  Location: [facility];  Service: Orthopedics;  Laterality: Right;  . TOOTH EXTRACTION     9 removed    FAMILY HISTORY Family History  Problem Relation Age of Onset  . Heart disease Father   . Stroke Mother   . Hypertension Mother   . Hyperlipidemia Mother     SOCIAL HISTORY Social History   Tobacco Use  . Smoking status: Former Smoker    Years: 15.00  . Smokeless tobacco: Never Used  . Tobacco comment: quit 1998 ish  Vaping Use  .  Vaping Use: Never used  Substance Use Topics  . Alcohol use: Yes    Comment: monthy maybe  . Drug use: No         OPHTHALMIC EXAM:  Base Eye Exam    Visual Acuity (Snellen - Linear)      Right Left   Dist cc 20/20 20/20   Correction: Glasses       Tonometry (Tonopen, 2:51 PM)      Right Left   Pressure 17 15       Pupils      Dark Light Shape React APD   Right 3 2 Round Brisk None   Left 3 2 Round Brisk None       Visual Fields (Counting fingers)      Left Right    Full Full       Extraocular Movement      Right Left    Full, Ortho Full, Ortho       Neuro/Psych    Oriented x3: Yes   Mood/Affect: Normal       Dilation    Both eyes: 1.0% Mydriacyl, 2.5% Phenylephrine @ 2:51 PM        Slit Lamp and Fundus Exam    Slit Lamp Exam      Right Left   Lids/Lashes mild MGD  mild MGD   Conjunctiva/Sclera White and quiet White and quiet   Cornea trace PEE, mild Debris in tear film trace PEE   Anterior Chamber deep and clear, narrow temporal angle deep and clear, narrow temporal angle   Iris round and dilated round and dilated   Lens 1 + Nucleus sclerois, 1+ cortical 1 + Nucleus sclerois, 1+ cortical   Vitreous mild syneresis mild syneresis       Fundus Exam      Right Left   Disc compact, pink and sharp compact, pink and sharp   C/D Ratio 0.1 0.1   Macula Good foveal reflex, focal CNVM/PED with pigment clumping and stable resolution of focal surrounding SRF -- nasal to fovea, trace cystic changes overlying CNV -- persistent, focal duse IT mac, No heme or edema flat, good foveal reflex, mild RPE mottling, no heme or edema   Vessels mild Vascular attenuation,  mild Tortuous, mild copper wiring mild Vascular attenuation,  mild Tortuous, mild copper wiring, mild A/V crossing changes   Periphery attached, no heme attached, no heme        Refraction    Wearing Rx      Sphere Cylinder Axis   Right +2.50 +0.25 140   Left +1.50 +0.25 065          IMAGING  AND PROCEDURES  Imaging and Procedures for _0 @  OCT, Retina - OU - Both Eyes  Right Eye Quality was good. Central Foveal Thickness: 272. Progression has been stable. Findings include normal foveal contour, no IRF, no SRF, outer retinal atrophy, vitreomacular adhesion , choroidal neovascular membrane, pigment epithelial detachment (Persistent trace cystic changes overlying CNVM nasal to fovea; +IRHM ).   Left Eye Quality was good. Central Foveal Thickness: 288. Progression has been stable. Findings include normal foveal contour, no IRF, no SRF, vitreomacular adhesion .   Notes *Images captured and stored on drive  Diagnosis / Impression:  OD: Persistent trace cystic changes overlying CNVM nasal to fovea; +IRHM  OS: NFP, no IRF/SRF, VMA  Clinical management:  See below  Abbreviations: NFP - Normal foveal profile. CME - cystoid macular edema. PED - pigment epithelial detachment. IRF - intraretinal fluid. SRF - subretinal fluid. EZ - ellipsoid zone. ERM - epiretinal membrane. ORA - outer retinal atrophy. ORT - outer retinal tubulation. SRHM - subretinal hyper-reflective material        Intravitreal Injection, Pharmacologic Agent - OD - Right Eye       Time Out 04/16/2020. 3:31 PM. Confirmed correct patient, procedure, site, and patient consented.   Anesthesia Topical anesthesia was used. Anesthetic medications included Lidocaine 2%, Proparacaine 0.5%.   Procedure Preparation included 5% betadine to ocular surface, eyelid speculum. A (32g) needle was used.   Injection:  2 mg aflibercept Alfonse Flavors) SOLN   NDC: M7179715, Lot: [id number], Expiration date: 04/23/2021   Route: Intravitreal, Site: Right Eye, Waste: 0.05 mL  Post-op Post injection exam found visual acuity of at least counting fingers. The patient tolerated the procedure well. There were no complications. The patient received written and verbal post procedure care education. Post injection medications were  not given.   Notes **PAP Medication Administered**                ASSESSMENT/PLAN:    ICD-10-CM   1. 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 Exudative age-related macular degeneration of right eye with active choroidal neovascularization (HCC)  H35.3211 Intravitreal Injection, Pharmacologic Agent - OD - Right Eye    aflibercept (EYLEA) SOLN 2 mg  3. 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 Essential hypertension  I10   5. Hypertensive retinopathy of both eyes  H35.033   6. Retinal edema  H35.81 OCT, Retina - OU - Both Eyes  7. 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  - discussed possible switch in medication -- PAP for Eylea approved  - Recommend IVE OD #1 today, 10.25.21  - RBA of procedure discussed, questions answered  - informed consent obtained  - IVA consent form signed and scanned on 04.16.21   - IVE consent form signed and scanned on 10.25.21  - see procedure note  - Eylea4U benefits investigation started, 09.24.21 --approved through PAP  - f/u in 4 weeks -- DFE, OCT, possible injection  5,6. Hypertensive retinopathy OU  - discussed importance of tight BP control  - monitor  7.  Mixed cataracts OU  - The symptoms of cataract, surgical options, and treatments and risks were discussed with patient.  - discussed diagnosis and progression  - not yet visually significant  - monitor for now   Ophthalmic Meds Ordered this visit:  Meds  ordered this encounter  Medications  . aflibercept (EYLEA) SOLN 2 mg      Return in about 4 weeks (around 05/14/2020) for f/u CNV OD, DFE, OCT.  There are no Patient Instructions on file for this visit.   This document serves as a record of services personally performed by Gardiner Sleeper, MD, PhD. It was created on their behalf by Leeann Must, [place], an ophthalmic technician. The creation of this record is the provider's dictation and/or activities during the visit.    Electronically signed by: Leeann Must, [place] 10.20.2021 4:57 PM   This document serves as a record of services personally performed by Gardiner Sleeper, MD, PhD. It was created on their behalf by San Jetty. Owens Shark, OA an ophthalmic technician. The creation of this record is the provider's dictation and/or activities during the visit.    Electronically signed by: San Jetty. Owens Shark, [place] 10.25.2021 4:57 PM  Gardiner Sleeper, M.D., Ph.D. Diseases & Surgery of the Retina and Vitreous [facility] 04/16/2020   I have reviewed the above documentation for accuracy and completeness, and I agree with the above. Gardiner Sleeper, M.D., Ph.D. 04/16/20 4:57 PM   Abbreviations: M myopia (nearsighted); A astigmatism; H hyperopia (farsighted); P presbyopia; Mrx spectacle prescription;  CTL contact lenses; OD right eye; OS left eye; OU both eyes  XT exotropia; ET esotropia; PEK punctate epithelial keratitis; PEE punctate epithelial erosions; DES dry eye syndrome; MGD meibomian gland dysfunction; ATs artificial tears; PFAT's preservative free artificial tears; [place] nuclear sclerotic cataract; PSC posterior subcapsular cataract; ERM epi-retinal membrane; PVD posterior vitreous detachment; RD retinal detachment; DM diabetes mellitus; DR diabetic retinopathy; NPDR non-proliferative diabetic retinopathy; PDR proliferative diabetic retinopathy; CSME clinically significant macular edema; DME diabetic macular edema; dbh dot blot  hemorrhages; CWS cotton wool spot; POAG primary open angle glaucoma; C/D cup-to-disc ratio; HVF humphrey visual field; GVF goldmann visual field; OCT optical coherence tomography; IOP intraocular pressure; BRVO Branch retinal vein occlusion; CRVO central retinal vein occlusion; CRAO central retinal artery occlusion; BRAO branch retinal artery occlusion; RT retinal tear; SB scleral buckle; PPV pars plana vitrectomy; VH Vitreous hemorrhage; PRP panretinal laser photocoagulation; IVK intravitreal kenalog; VMT vitreomacular traction; MH Macular hole;  NVD neovascularization of the disc; NVE neovascularization elsewhere; AREDS age related eye disease study; ARMD age related macular degeneration; POAG primary open angle glaucoma; EBMD epithelial/anterior basement membrane dystrophy; ACIOL anterior chamber intraocular lens; IOL intraocular lens; PCIOL posterior chamber intraocular lens; Phaco/IOL phacoemulsification with intraocular lens placement; [place] photorefractive keratectomy; LASIK laser assisted in situ keratomileusis; HTN hypertension; DM diabetes mellitus; COPD chronic obstructive pulmonary disease

## 2020-04-13 ENCOUNTER — Encounter (INDEPENDENT_AMBULATORY_CARE_PROVIDER_SITE_OTHER): Payer: BLUE CROSS/BLUE SHIELD | Admitting: Ophthalmology

## 2020-04-13 DIAGNOSIS — Z0000 Encounter for general adult medical examination without abnormal findings: Secondary | ICD-10-CM | POA: Diagnosis not present

## 2020-04-13 DIAGNOSIS — N400 Enlarged prostate without lower urinary tract symptoms: Secondary | ICD-10-CM | POA: Diagnosis not present

## 2020-04-13 DIAGNOSIS — K219 Gastro-esophageal reflux disease without esophagitis: Secondary | ICD-10-CM | POA: Diagnosis not present

## 2020-04-13 DIAGNOSIS — E785 Hyperlipidemia, unspecified: Secondary | ICD-10-CM | POA: Diagnosis not present

## 2020-04-16 ENCOUNTER — Other Ambulatory Visit: Payer: Self-pay

## 2020-04-16 ENCOUNTER — Ambulatory Visit (INDEPENDENT_AMBULATORY_CARE_PROVIDER_SITE_OTHER): Payer: BLUE CROSS/BLUE SHIELD | Admitting: Ophthalmology

## 2020-04-16 ENCOUNTER — Encounter (INDEPENDENT_AMBULATORY_CARE_PROVIDER_SITE_OTHER): Payer: Self-pay | Admitting: Ophthalmology

## 2020-04-16 DIAGNOSIS — H3581 Retinal edema: Secondary | ICD-10-CM | POA: Diagnosis not present

## 2020-04-16 DIAGNOSIS — H353211 Exudative age-related macular degeneration, right eye, with active choroidal neovascularization: Secondary | ICD-10-CM

## 2020-04-16 DIAGNOSIS — H35051 Retinal neovascularization, unspecified, right eye: Secondary | ICD-10-CM | POA: Diagnosis not present

## 2020-04-16 DIAGNOSIS — I10 Essential (primary) hypertension: Secondary | ICD-10-CM | POA: Diagnosis not present

## 2020-04-16 DIAGNOSIS — H25813 Combined forms of age-related cataract, bilateral: Secondary | ICD-10-CM

## 2020-04-16 DIAGNOSIS — H35711 Central serous chorioretinopathy, right eye: Secondary | ICD-10-CM

## 2020-04-16 DIAGNOSIS — H35033 Hypertensive retinopathy, bilateral: Secondary | ICD-10-CM

## 2020-04-16 MED ORDER — AFLIBERCEPT 2MG/0.05ML IZ SOLN FOR KALEIDOSCOPE
2.0000 mg | INTRAVITREAL | Status: AC | PRN
  Administered 2020-04-16: 2 mg via INTRAVITREAL

## 2020-05-10 NOTE — Progress Notes (Signed)
[facility] Note  05/14/2020     CHIEF COMPLAINT Patient presents for Retina Follow Up   HISTORY OF PRESENT ILLNESS: Tristan Thomas is a 49 y.o. male who presents to the clinic today for:   HPI    Retina Follow Up    Patient presents with  Other.  In right eye.  This started weeks ago.  Severity is moderate.  Duration of weeks.  Since onset it is stable.  I, the attending physician,  performed the HPI with the patient and updated documentation appropriately.          Comments    Pt states vision is the same OU.  Pt denies eye pain or discomfort and denies any new or worsening floaters or fol OU.       Last edited by Bernarda Caffey, MD on 05/14/2020  4:21 PM. (History)    Patient states vision is stable  Referring physician: Janie Morning, DO [place],  [place] [id number]  HISTORICAL INFORMATION:   Selected notes from the MEDICAL RECORD NUMBER Referred by Dr. Martinique DeMarco for concern of worsening PED   CURRENT MEDICATIONS: No current outpatient medications on file. (Ophthalmic Drugs)   No current facility-administered medications for this visit. (Ophthalmic Drugs)   Current Outpatient Medications (Other)  Medication Sig  . buPROPion (WELLBUTRIN) 75 MG tablet Take 75 mg by mouth 2 (two) times daily.  . busPIRone (BUSPAR) 5 MG tablet Take 5 mg by mouth 3 (three) times daily.  . Cetirizine HCl 10 MG CAPS Take 1 capsule (10 mg total) by mouth daily for 10 days.  . fluticasone (FLONASE) 50 MCG/ACT nasal spray Place 1 spray into both nostrils daily as needed for allergies or rhinitis.  Marland Kitchen ibuprofen (ADVIL,MOTRIN) 600 MG tablet Take 1 tablet (600 mg total) by mouth every 6 (six) hours as needed.  . loratadine (CLARITIN) 10 MG tablet Take 10 mg by mouth daily.  Marland Kitchen omeprazole (PRILOSEC) 20 MG capsule Take 20 mg by mouth 3 times/day as needed-between meals & bedtime.   Marland Kitchen oxyCODONE-acetaminophen (PERCOCET) 5-325 MG tablet  Take 1 tablet by mouth every 4 (four) hours as needed. (Patient taking differently: Take 1 tablet by mouth every 4 (four) hours as needed. )  . oxyCODONE-acetaminophen (ROXICET) 5-325 MG tablet Take 1 tablet by mouth every 4 (four) hours as needed for severe pain.  . SUMAtriptan (IMITREX) 100 MG tablet Take 100 mg by mouth every 2 (two) hours as needed for migraine. May repeat in 2 hours if headache persists or recurs.  . tamsulosin (FLOMAX) 0.4 MG CAPS capsule tamsulosin 0.4 mg capsule  . venlafaxine XR (EFFEXOR-XR) 150 MG 24 hr capsule Take 150 mg by mouth daily.   No current facility-administered medications for this visit. (Other)      REVIEW OF SYSTEMS: ROS    Positive for: Gastrointestinal, Musculoskeletal, Eyes, Respiratory, Psychiatric   Negative for: Constitutional, Neurological, Skin, Genitourinary, HENT, Endocrine, Cardiovascular, Allergic/Imm, Heme/Lymph   Last edited by Doneen Poisson on 05/14/2020  2:55 PM. (History)       ALLERGIES Allergies  Allergen Reactions  . Hydrocodone-Acetaminophen Other (See Comments)    Pt got cold-chills     PAST MEDICAL HISTORY Past Medical History:  Diagnosis Date  . Anxiety   . Arthritis   . Asthma    not diagnosed  . Cataract    Mixed OU  . GERD (gastroesophageal reflux disease)   . Hypertensive retinopathy  OU   Past Surgical History:  Procedure Laterality Date  . ORIF ANKLE FRACTURE Right 04/15/2017   Procedure: OPEN REDUCTION INTERNAL FIXATION (ORIF) RIGHT ANKLE AND SYNDESMOSIS;  Surgeon: Newt Minion, MD;  Location: [facility];  Service: Orthopedics;  Laterality: Right;  . TOOTH EXTRACTION     9 removed    FAMILY HISTORY Family History  Problem Relation Age of Onset  . Heart disease Father   . Stroke Mother   . Hypertension Mother   . Hyperlipidemia Mother     SOCIAL HISTORY Social History   Tobacco Use  . Smoking status: Former Smoker    Years: 15.00  . Smokeless tobacco: Never Used  . Tobacco  comment: quit 1998 ish  Vaping Use  . Vaping Use: Never used  Substance Use Topics  . Alcohol use: Yes    Comment: monthy maybe  . Drug use: No         OPHTHALMIC EXAM:  Base Eye Exam    Visual Acuity (Snellen - Linear)      Right Left   Dist cc 20/25 +2 20/25 +1   Dist ph cc 20/20 -1 20/20 -1   Correction: Glasses       Tonometry (Tonopen, 2:59 PM)      Right Left   Pressure 12 12       Pupils      Dark Light Shape React APD   Right 3 2 Round Brisk 0   Left 3 2 Round Brisk 0       Visual Fields      Left Right    Full Full       Extraocular Movement      Right Left    Full Full       Neuro/Psych    Oriented x3: Yes   Mood/Affect: Normal       Dilation    Both eyes: 1.0% Mydriacyl, 2.5% Phenylephrine @ 2:59 PM        Slit Lamp and Fundus Exam    Slit Lamp Exam      Right Left   Lids/Lashes mild MGD  mild MGD   Conjunctiva/Sclera White and quiet White and quiet   Cornea trace PEE, mild Debris in tear film trace PEE   Anterior Chamber deep and clear, narrow temporal angle deep and clear, narrow temporal angle   Iris round and dilated round and dilated   Lens 1 + Nucleus sclerois, 1+ cortical 1 + Nucleus sclerois, 1+ cortical   Vitreous mild syneresis mild syneresis       Fundus Exam      Right Left   Disc compact, pink and sharp compact, pink and sharp   C/D Ratio 0.1 0.1   Macula Good foveal reflex, focal CNVM/PED with pigment clumping and stable resolution of focal surrounding SRF -- nasal to fovea, trace cystic changes overlying CNV improved , focal druse IT mac, No heme or edema flat, good foveal reflex, mild RPE mottling, no heme or edema   Vessels mild Vascular attenuation,  mild Tortuous, mild copper wiring mild Vascular attenuation,  mild Tortuous, mild copper wiring, mild A/V crossing changes   Periphery attached, no heme attached, no heme        Refraction    Wearing Rx      Sphere Cylinder Axis   Right +2.50 +0.25 140   Left  +1.50 +0.25 065          IMAGING AND PROCEDURES  Imaging and Procedures  for _0 @  OCT, Retina - OU - Both Eyes       Right Eye Quality was good. Central Foveal Thickness: 268. Progression has improved. Findings include normal foveal contour, no IRF, no SRF, outer retinal atrophy, vitreomacular adhesion , choroidal neovascular membrane, pigment epithelial detachment, intraretinal hyper-reflective material (Interval improvement in IRF/IRHM overlying CNVM nasal to fovea; interval improvement in ORA surrounding CNV).   Left Eye Quality was good. Central Foveal Thickness: 285. Progression has been stable. Findings include normal foveal contour, no IRF, no SRF, vitreomacular adhesion .   Notes *Images captured and stored on drive  Diagnosis / Impression:  OD: Interval improvement in IRF/IRHM overlying CNVM nasal to fovea; interval improvement in ORA surrounding CNV OS: NFP, no IRF/SRF, VMA  Clinical management:  See below  Abbreviations: NFP - Normal foveal profile. CME - cystoid macular edema. PED - pigment epithelial detachment. IRF - intraretinal fluid. SRF - subretinal fluid. EZ - ellipsoid zone. ERM - epiretinal membrane. ORA - outer retinal atrophy. ORT - outer retinal tubulation. SRHM - subretinal hyper-reflective material        Intravitreal Injection, Pharmacologic Agent - OD - Right Eye       Time Out 05/14/2020. 3:23 PM. Confirmed correct patient, procedure, site, and patient consented.   Anesthesia Topical anesthesia was used. Anesthetic medications included Lidocaine 2%, Proparacaine 0.5%.   Procedure Preparation included 5% betadine to ocular surface, eyelid speculum. A (32g) needle was used.   Injection:  2 mg aflibercept Alfonse Flavors) SOLN   NDC: [id number], Lot: [id number], Expiration date: 11/21/2020   Route: Intravitreal, Site: Right Eye, Waste: 0.05 mL  Post-op Post injection exam found visual acuity of at least counting fingers. The patient tolerated  the procedure well. There were no complications. The patient received written and verbal post procedure care education. Post injection medications were not given.   Notes **PAP Medication Administered**                ASSESSMENT/PLAN:    ICD-10-CM   1. 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 Exudative age-related macular degeneration of right eye with active choroidal neovascularization (HCC)  H35.3211 Intravitreal Injection, Pharmacologic Agent - OD - Right Eye    aflibercept (EYLEA) SOLN 2 mg  3. 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 Essential hypertension  I10   5. Hypertensive retinopathy of both eyes  H35.033   6. Retinal edema  H35.81 OCT, Retina - OU - Both Eyes  7. 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 Hypertensive retinopathy OU  - discussed importance of tight BP control  - monitor  7.  Mixed cataracts OU  - The symptoms of cataract, surgical options, and  treatments and risks were discussed with patient.  - discussed diagnosis and progression  - not yet visually significant  - monitor for now   Ophthalmic Meds Ordered this visit:  Meds ordered this encounter  Medications  . aflibercept (EYLEA) SOLN 2 mg      Return in about 4 weeks (around 06/11/2020) for f/u CNV OD, DFE, OCT.  There are no Patient Instructions on file for this visit.   This document serves as a record of services personally performed by Gardiner Sleeper, MD, PhD. It was created on their behalf by Leeann Must, [place], an ophthalmic technician. The creation of this record is the provider's dictation and/or activities during the visit.    This document serves as a record of services personally performed by Gardiner Sleeper, MD, PhD. It was created on their behalf by Leeann Must, [place], an ophthalmic technician. The creation of this record is the provider's dictation and/or activities during the visit.    Electronically signed by: Leeann Must, [place] 11.18.2021 4:25 PM   This document serves as a record of services personally performed by Gardiner Sleeper, MD, PhD. It was created on their behalf by San Jetty. Owens Shark, OA an ophthalmic technician. The creation of this record is the provider's dictation and/or activities during the visit.    Electronically signed by: San Jetty. Owens Shark, [place] 11.22.2021 4:25 PM  Gardiner Sleeper, M.D., Ph.D. Diseases & Surgery of the Retina and [place] 05/14/2020   I have reviewed the above documentation for accuracy and completeness, and I agree with the above. Gardiner Sleeper, M.D., Ph.D. 05/14/20 4:25 PM  Abbreviations: M myopia (nearsighted); A astigmatism; H hyperopia (farsighted); P presbyopia; Mrx spectacle prescription;  CTL contact lenses; OD right eye; OS left eye; OU both eyes  XT exotropia; ET esotropia; PEK punctate epithelial keratitis; PEE punctate epithelial erosions; DES dry eye syndrome; MGD  meibomian gland dysfunction; ATs artificial tears; PFAT's preservative free artificial tears; [place] nuclear sclerotic cataract; PSC posterior subcapsular cataract; ERM epi-retinal membrane; PVD posterior vitreous detachment; RD retinal detachment; DM diabetes mellitus; DR diabetic retinopathy; NPDR non-proliferative diabetic retinopathy; PDR proliferative diabetic retinopathy; CSME clinically significant macular edema; DME diabetic macular edema; dbh dot blot hemorrhages; CWS cotton wool spot; POAG primary open angle glaucoma; C/D cup-to-disc ratio; HVF humphrey visual field; GVF goldmann visual field; OCT optical coherence tomography; IOP intraocular pressure; BRVO Branch retinal vein occlusion; CRVO central retinal vein occlusion; CRAO central retinal artery occlusion; BRAO branch retinal artery occlusion; RT retinal tear; SB scleral buckle; PPV pars plana vitrectomy; VH Vitreous hemorrhage; PRP panretinal laser photocoagulation; IVK intravitreal kenalog; VMT vitreomacular traction; MH Macular hole;  NVD neovascularization of the disc; NVE neovascularization elsewhere; AREDS age related eye disease study; ARMD age related macular degeneration; POAG primary open angle glaucoma; EBMD epithelial/anterior basement membrane dystrophy; ACIOL anterior chamber intraocular lens; IOL intraocular lens; PCIOL posterior chamber intraocular lens; Phaco/IOL phacoemulsification with intraocular lens placement; [place] photorefractive keratectomy; LASIK laser assisted in situ keratomileusis; HTN hypertension; DM diabetes mellitus; COPD chronic obstructive pulmonary disease

## 2020-05-14 ENCOUNTER — Encounter (INDEPENDENT_AMBULATORY_CARE_PROVIDER_SITE_OTHER): Payer: Self-pay | Admitting: Ophthalmology

## 2020-05-14 ENCOUNTER — Ambulatory Visit (INDEPENDENT_AMBULATORY_CARE_PROVIDER_SITE_OTHER): Payer: BLUE CROSS/BLUE SHIELD | Admitting: Ophthalmology

## 2020-05-14 ENCOUNTER — Other Ambulatory Visit: Payer: Self-pay

## 2020-05-14 DIAGNOSIS — H353211 Exudative age-related macular degeneration, right eye, with active choroidal neovascularization: Secondary | ICD-10-CM | POA: Diagnosis not present

## 2020-05-14 DIAGNOSIS — H35051 Retinal neovascularization, unspecified, right eye: Secondary | ICD-10-CM | POA: Diagnosis not present

## 2020-05-14 DIAGNOSIS — H35033 Hypertensive retinopathy, bilateral: Secondary | ICD-10-CM

## 2020-05-14 DIAGNOSIS — H35711 Central serous chorioretinopathy, right eye: Secondary | ICD-10-CM

## 2020-05-14 DIAGNOSIS — H3581 Retinal edema: Secondary | ICD-10-CM

## 2020-05-14 DIAGNOSIS — I10 Essential (primary) hypertension: Secondary | ICD-10-CM

## 2020-05-14 DIAGNOSIS — H25813 Combined forms of age-related cataract, bilateral: Secondary | ICD-10-CM

## 2020-05-14 MED ORDER — AFLIBERCEPT 2MG/0.05ML IZ SOLN FOR KALEIDOSCOPE
2.0000 mg | INTRAVITREAL | Status: AC | PRN
  Administered 2020-05-14: 2 mg via INTRAVITREAL

## 2020-05-25 DIAGNOSIS — F331 Major depressive disorder, recurrent, moderate: Secondary | ICD-10-CM | POA: Diagnosis not present

## 2020-05-25 DIAGNOSIS — F411 Generalized anxiety disorder: Secondary | ICD-10-CM | POA: Diagnosis not present

## 2020-06-11 ENCOUNTER — Encounter (INDEPENDENT_AMBULATORY_CARE_PROVIDER_SITE_OTHER): Payer: Self-pay

## 2020-06-11 ENCOUNTER — Encounter (INDEPENDENT_AMBULATORY_CARE_PROVIDER_SITE_OTHER): Payer: BLUE CROSS/BLUE SHIELD | Admitting: Ophthalmology

## 2020-06-12 ENCOUNTER — Ambulatory Visit (INDEPENDENT_AMBULATORY_CARE_PROVIDER_SITE_OTHER): Payer: BC Managed Care – PPO | Admitting: Ophthalmology

## 2020-06-12 ENCOUNTER — Encounter (INDEPENDENT_AMBULATORY_CARE_PROVIDER_SITE_OTHER): Payer: Self-pay | Admitting: Ophthalmology

## 2020-06-12 ENCOUNTER — Other Ambulatory Visit: Payer: Self-pay

## 2020-06-12 DIAGNOSIS — H3581 Retinal edema: Secondary | ICD-10-CM

## 2020-06-12 DIAGNOSIS — H353211 Exudative age-related macular degeneration, right eye, with active choroidal neovascularization: Secondary | ICD-10-CM | POA: Diagnosis not present

## 2020-06-12 DIAGNOSIS — H25813 Combined forms of age-related cataract, bilateral: Secondary | ICD-10-CM

## 2020-06-12 DIAGNOSIS — I10 Essential (primary) hypertension: Secondary | ICD-10-CM

## 2020-06-12 DIAGNOSIS — H35051 Retinal neovascularization, unspecified, right eye: Secondary | ICD-10-CM

## 2020-06-12 DIAGNOSIS — H35033 Hypertensive retinopathy, bilateral: Secondary | ICD-10-CM

## 2020-06-12 MED ORDER — AFLIBERCEPT 2MG/0.05ML IZ SOLN FOR KALEIDOSCOPE
2.0000 mg | INTRAVITREAL | Status: AC | PRN
  Administered 2020-06-12: 2 mg via INTRAVITREAL

## 2020-06-12 NOTE — Progress Notes (Signed)
[facility] Note  06/12/2020     CHIEF COMPLAINT Patient presents for Retina Follow Up   HISTORY OF PRESENT ILLNESS: Tristan Thomas is a 49 y.o. male who presents to the clinic today for:   HPI    Retina Follow Up    Patient presents with  Other.  In right eye.  This started 4 weeks ago.  I, the attending physician,  performed the HPI with the patient and updated documentation appropriately.          Comments    Patient here for 4 weeks retina follow up for CNV OD. Patient states vision doing fine. No eye pain.        Last edited by Bernarda Caffey, MD on 06/12/2020  8:50 PM. (History)    pt states vision is stable  Referring physician: Janie Morning, DO [place],  [place] [id number]  HISTORICAL INFORMATION:   Selected notes from the MEDICAL RECORD NUMBER Referred by Dr. Martinique DeMarco for concern of worsening PED   CURRENT MEDICATIONS: No current outpatient medications on file. (Ophthalmic Drugs)   No current facility-administered medications for this visit. (Ophthalmic Drugs)   Current Outpatient Medications (Other)  Medication Sig  . buPROPion (WELLBUTRIN) 75 MG tablet Take 75 mg by mouth 2 (two) times daily.  . busPIRone (BUSPAR) 5 MG tablet Take 5 mg by mouth 3 (three) times daily.  . Cetirizine HCl 10 MG CAPS Take 1 capsule (10 mg total) by mouth daily for 10 days.  . fluticasone (FLONASE) 50 MCG/ACT nasal spray Place 1 spray into both nostrils daily as needed for allergies or rhinitis.  Marland Kitchen ibuprofen (ADVIL,MOTRIN) 600 MG tablet Take 1 tablet (600 mg total) by mouth every 6 (six) hours as needed.  . loratadine (CLARITIN) 10 MG tablet Take 10 mg by mouth daily.  Marland Kitchen omeprazole (PRILOSEC) 20 MG capsule Take 20 mg by mouth 3 times/day as needed-between meals & bedtime.   Marland Kitchen oxyCODONE-acetaminophen (PERCOCET) 5-325 MG tablet Take 1 tablet by mouth every 4 (four) hours as needed. (Patient taking differently: Take 1  tablet by mouth every 4 (four) hours as needed. )  . oxyCODONE-acetaminophen (ROXICET) 5-325 MG tablet Take 1 tablet by mouth every 4 (four) hours as needed for severe pain.  . SUMAtriptan (IMITREX) 100 MG tablet Take 100 mg by mouth every 2 (two) hours as needed for migraine. May repeat in 2 hours if headache persists or recurs.  . tamsulosin (FLOMAX) 0.4 MG CAPS capsule tamsulosin 0.4 mg capsule  . venlafaxine XR (EFFEXOR-XR) 150 MG 24 hr capsule Take 150 mg by mouth daily.   No current facility-administered medications for this visit. (Other)      REVIEW OF SYSTEMS: ROS    Positive for: Gastrointestinal, Musculoskeletal, Eyes, Respiratory, Psychiatric   Negative for: Constitutional, Neurological, Skin, Genitourinary, HENT, Endocrine, Cardiovascular, Allergic/Imm, Heme/Lymph   Last edited by Theodore Demark, COA on 06/12/2020  2:49 PM. (History)       ALLERGIES Allergies  Allergen Reactions  . Hydrocodone-Acetaminophen Other (See Comments)    Pt got cold-chills     PAST MEDICAL HISTORY Past Medical History:  Diagnosis Date  . Anxiety   . Arthritis   . Asthma    not diagnosed  . Cataract    Mixed OU  . GERD (gastroesophageal reflux disease)   . Hypertensive retinopathy    OU   Past Surgical History:  Procedure Laterality Date  . ORIF ANKLE FRACTURE  Right 04/15/2017   Procedure: OPEN REDUCTION INTERNAL FIXATION (ORIF) RIGHT ANKLE AND SYNDESMOSIS;  Surgeon: Newt Minion, MD;  Location: [place];  Service: Orthopedics;  Laterality: Right;  . TOOTH EXTRACTION     9 removed    FAMILY HISTORY Family History  Problem Relation Age of Onset  . Heart disease Father   . Stroke Mother   . Hypertension Mother   . Hyperlipidemia Mother     SOCIAL HISTORY Social History   Tobacco Use  . Smoking status: Former Smoker    Years: 15.00  . Smokeless tobacco: Never Used  . Tobacco comment: quit 1998 ish  Vaping Use  . Vaping Use: Never used  Substance Use Topics  .  Alcohol use: Yes    Comment: monthy maybe  . Drug use: No         OPHTHALMIC EXAM:  Base Eye Exam    Visual Acuity (Snellen - Linear)      Right Left   Dist cc 20/20 20/20 -1   Correction: Glasses       Tonometry (Tonopen, 2:47 PM)      Right Left   Pressure 12 11       Pupils      Dark Light Shape React APD   Right 3 2 Round Brisk None   Left 3 2 Round Brisk None       Visual Fields (Counting fingers)      Left Right    Full Full       Extraocular Movement      Right Left    Full Full       Neuro/Psych    Oriented x3: Yes   Mood/Affect: Normal       Dilation    Both eyes: 1.0% Mydriacyl, 2.5% Phenylephrine @ 2:46 PM        Slit Lamp and Fundus Exam    Slit Lamp Exam      Right Left   Lids/Lashes mild MGD  mild MGD   Conjunctiva/Sclera White and quiet White and quiet   Cornea trace PEE, mild Debris in tear film trace PEE   Anterior Chamber deep and clear, narrow temporal angle deep and clear, narrow temporal angle   Iris round and dilated round and dilated   Lens 1 + Nucleus sclerois, 1+ cortical 1 + Nucleus sclerois, 1+ cortical   Vitreous mild syneresis mild syneresis       Fundus Exam      Right Left   Disc compact, pink and sharp compact, pink and sharp   C/D Ratio 0.1 0.1   Macula Good foveal reflex, focal CNVM/PED with pigment clumping and stable resolution of focal surrounding SRF -- nasal to fovea, trace cystic changes overlying CNV - stably resolved, focal druse IT mac, No heme or edema flat, good foveal reflex, mild RPE mottling, no heme or edema   Vessels mild Vascular attenuation,  mild Tortuous, mild copper wiring mild Vascular attenuation,  mild Tortuous, mild copper wiring, mild A/V crossing changes   Periphery attached, no heme attached, no heme        Refraction    Wearing Rx      Sphere Cylinder Axis   Right +2.50 +0.25 140   Left +1.50 +0.25 065          IMAGING AND PROCEDURES  Imaging and Procedures for  _0 @  OCT, Retina - OU - Both Eyes       Right Eye Quality was good. Central  Foveal Thickness: 264. Progression has improved. Findings include normal foveal contour, no IRF, no SRF, outer retinal atrophy, vitreomacular adhesion , choroidal neovascular membrane, pigment epithelial detachment, intraretinal hyper-reflective material (stable improvement in IRF/IRHM overlying CNVM nasal to fovea; interval improvement in ORA/ellipsoid signal surrounding CNV).   Left Eye Quality was good. Central Foveal Thickness: 285. Progression has been stable. Findings include normal foveal contour, no IRF, no SRF, vitreomacular adhesion .   Notes *Images captured and stored on drive  Diagnosis / Impression:  OD: stable improvement in IRF/IRHM overlying CNVM nasal to fovea; interval improvement in ORA/ellipsoid signal surrounding CNV OS: NFP, no IRF/SRF, VMA  Clinical management:  See below  Abbreviations: NFP - Normal foveal profile. CME - cystoid macular edema. PED - pigment epithelial detachment. IRF - intraretinal fluid. SRF - subretinal fluid. EZ - ellipsoid zone. ERM - epiretinal membrane. ORA - outer retinal atrophy. ORT - outer retinal tubulation. SRHM - subretinal hyper-reflective material        Intravitreal Injection, Pharmacologic Agent - OD - Right Eye       Time Out 06/12/2020. 3:39 PM. Confirmed correct patient, procedure, site, and patient consented.   Anesthesia Topical anesthesia was used. Anesthetic medications included Lidocaine 2%, Proparacaine 0.5%.   Procedure Preparation included 5% betadine to ocular surface, eyelid speculum. A (32 g) needle was used.   Injection:  2 mg aflibercept Alfonse Flavors) SOLN   NDC: [id number], Lot: [id number], Expiration date: 12/20/2020   Route: Intravitreal, Site: Right Eye, Waste: 0.05 mL  Post-op Post injection exam found visual acuity of at least counting fingers. The patient tolerated the procedure well. There were no complications. The  patient received written and verbal post procedure care education. Post injection medications were not given.   Notes **PAP MEDICATION ADMINISTERED**                ASSESSMENT/PLAN:    ICD-10-CM   1. 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 Exudative age-related macular degeneration of right eye with active choroidal neovascularization (HCC)  H35.3211 Intravitreal Injection, Pharmacologic Agent - OD - Right Eye    aflibercept (EYLEA) SOLN 2 mg  3. Retinal edema  H35.81 OCT, Retina - OU - Both Eyes  4. Essential hypertension  I10   5. Hypertensive retinopathy of both eyes  H35.033   6. 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 Hypertensive retinopathy OU  - discussed importance of tight BP control  - monitor  6.  Mixed cataracts OU  - The symptoms of cataract, surgical options, and treatments and risks were  discussed with patient.  - discussed diagnosis and progression  - not yet visually significant  - monitor  Ophthalmic Meds Ordered this visit:  Meds ordered this encounter  Medications  . aflibercept (EYLEA) SOLN 2 mg      Return in about 6 weeks (around 07/24/2020) for f/u idiopathic CNV OD, DFE, OCT.  There are no Patient Instructions on file for this visit.  This document serves as a record of services personally performed by Gardiner Sleeper, MD, PhD. It was created on their behalf by San Jetty. Owens Shark, OA an ophthalmic technician. The creation of this record is the provider's dictation and/or activities during the visit.    Electronically signed by: San Jetty. Marguerita Merles 12.21.2021 8:56 PM  Gardiner Sleeper, M.D., Ph.D. Diseases & Surgery of the Retina and [place] 06/12/2020   I have reviewed the above documentation for accuracy and completeness, and I agree with the above. Gardiner Sleeper, M.D., Ph.D. 06/12/20 8:56 PM   Abbreviations: M myopia (nearsighted); A astigmatism; H hyperopia (farsighted); P presbyopia; Mrx spectacle prescription;  CTL contact lenses; OD right eye; OS left eye; OU both eyes  XT exotropia; ET esotropia; PEK punctate epithelial keratitis; PEE punctate epithelial erosions; DES dry eye syndrome; MGD meibomian gland dysfunction; ATs artificial tears; PFAT's preservative free artificial tears; [place] nuclear sclerotic cataract; PSC posterior subcapsular cataract; ERM epi-retinal membrane; PVD posterior vitreous detachment; RD retinal detachment; DM diabetes mellitus; DR diabetic retinopathy; NPDR non-proliferative diabetic retinopathy; PDR proliferative diabetic retinopathy; CSME clinically significant macular edema; DME diabetic macular edema; dbh dot blot hemorrhages; CWS cotton wool spot; POAG primary open angle glaucoma; C/D cup-to-disc ratio; HVF humphrey visual field; GVF goldmann visual field; OCT optical coherence tomography; IOP  intraocular pressure; BRVO Branch retinal vein occlusion; CRVO central retinal vein occlusion; CRAO central retinal artery occlusion; BRAO branch retinal artery occlusion; RT retinal tear; SB scleral buckle; PPV pars plana vitrectomy; VH Vitreous hemorrhage; PRP panretinal laser photocoagulation; IVK intravitreal kenalog; VMT vitreomacular traction; MH Macular hole;  NVD neovascularization of the disc; NVE neovascularization elsewhere; AREDS age related eye disease study; ARMD age related macular degeneration; POAG primary open angle glaucoma; EBMD epithelial/anterior basement membrane dystrophy; ACIOL anterior chamber intraocular lens; IOL intraocular lens; PCIOL posterior chamber intraocular lens; Phaco/IOL phacoemulsification with intraocular lens placement; [place] photorefractive keratectomy; LASIK laser assisted in situ keratomileusis; HTN hypertension; DM diabetes mellitus; COPD chronic obstructive pulmonary disease

## 2020-07-07 DIAGNOSIS — U071 COVID-19: Secondary | ICD-10-CM | POA: Diagnosis not present

## 2020-07-07 DIAGNOSIS — J22 Unspecified acute lower respiratory infection: Secondary | ICD-10-CM | POA: Diagnosis not present

## 2020-07-23 NOTE — Progress Notes (Signed)
[facility] Note  07/27/2020     CHIEF COMPLAINT Patient presents for Retina Follow Up   HISTORY OF PRESENT ILLNESS: Tristan Thomas is a 50 y.o. male who presents to the clinic today for:   HPI    Retina Follow Up    Patient presents with  Other.  In right eye.  This started weeks ago.  Severity is moderate.  Duration of weeks.  Since onset it is stable.  I, the attending physician,  performed the HPI with the patient and updated documentation appropriately.          Comments    Pt states vision is the same OU.  Pt states he had some mild eye pain OD after last injection.  Denies eye pain today.  Denies any new or worsening floaters or fol OU.       Last edited by Bernarda Caffey, MD on 07/27/2020 10:15 AM. (History)    pt states he has not noticed any change in vision  Referring physician: Janie Morning, DO [place],  [place] [id number]  HISTORICAL INFORMATION:   Selected notes from the [facility] Referred by Dr. Martinique DeMarco for concern of worsening PED   CURRENT MEDICATIONS: No current outpatient medications on file. (Ophthalmic Drugs)   No current facility-administered medications for this visit. (Ophthalmic Drugs)   Current Outpatient Medications (Other)  Medication Sig  . buPROPion (WELLBUTRIN) 75 MG tablet Take 75 mg by mouth 2 (two) times daily.  . busPIRone (BUSPAR) 5 MG tablet Take 5 mg by mouth 3 (three) times daily.  . Cetirizine HCl 10 MG CAPS Take 1 capsule (10 mg total) by mouth daily for 10 days.  . fluticasone (FLONASE) 50 MCG/ACT nasal spray Place 1 spray into both nostrils daily as needed for allergies or rhinitis.  Marland Kitchen ibuprofen (ADVIL,MOTRIN) 600 MG tablet Take 1 tablet (600 mg total) by mouth every 6 (six) hours as needed.  . loratadine (CLARITIN) 10 MG tablet Take 10 mg by mouth daily.  Marland Kitchen omeprazole (PRILOSEC) 20 MG capsule Take 20 mg by mouth 3 times/day as needed-between meals &  bedtime.   Marland Kitchen oxyCODONE-acetaminophen (PERCOCET) 5-325 MG tablet Take 1 tablet by mouth every 4 (four) hours as needed. (Patient taking differently: Take 1 tablet by mouth every 4 (four) hours as needed. )  . oxyCODONE-acetaminophen (ROXICET) 5-325 MG tablet Take 1 tablet by mouth every 4 (four) hours as needed for severe pain.  . SUMAtriptan (IMITREX) 100 MG tablet Take 100 mg by mouth every 2 (two) hours as needed for migraine. May repeat in 2 hours if headache persists or recurs.  . tamsulosin (FLOMAX) 0.4 MG CAPS capsule tamsulosin 0.4 mg capsule  . venlafaxine XR (EFFEXOR-XR) 150 MG 24 hr capsule Take 150 mg by mouth daily.   No current facility-administered medications for this visit. (Other)      REVIEW OF SYSTEMS: ROS    Positive for: Gastrointestinal, Musculoskeletal, Eyes, Respiratory, Psychiatric   Negative for: Constitutional, Neurological, Skin, Genitourinary, HENT, Endocrine, Cardiovascular, Allergic/Imm, Heme/Lymph   Last edited by Doneen Poisson on 07/27/2020  8:34 AM. (History)       ALLERGIES Allergies  Allergen Reactions  . Hydrocodone-Acetaminophen Other (See Comments)    Pt got cold-chills     PAST MEDICAL HISTORY Past Medical History:  Diagnosis Date  . Anxiety   . Arthritis   . Asthma    not diagnosed  . Cataract  Mixed OU  . GERD (gastroesophageal reflux disease)   . Hypertensive retinopathy    OU   Past Surgical History:  Procedure Laterality Date  . ORIF ANKLE FRACTURE Right 04/15/2017   Procedure: OPEN REDUCTION INTERNAL FIXATION (ORIF) RIGHT ANKLE AND SYNDESMOSIS;  Surgeon: Newt Minion, MD;  Location: [facility];  Service: Orthopedics;  Laterality: Right;  . TOOTH EXTRACTION     9 removed    FAMILY HISTORY Family History  Problem Relation Age of Onset  . Heart disease Father   . Stroke Mother   . Hypertension Mother   . Hyperlipidemia Mother     SOCIAL HISTORY Social History   Tobacco Use  . Smoking status: Former Smoker     Years: 15.00  . Smokeless tobacco: Never Used  . Tobacco comment: quit 1998 ish  Vaping Use  . Vaping Use: Never used  Substance Use Topics  . Alcohol use: Yes    Comment: monthy maybe  . Drug use: No         OPHTHALMIC EXAM:  Base Eye Exam    Visual Acuity (Snellen - Linear)      Right Left   Dist cc 20/20 20/20   Correction: Glasses       Tonometry (Tonopen, 8:38 AM)      Right Left   Pressure 14 14       Pupils      Dark Light Shape React APD   Right 3 2 Round Brisk 0   Left 3 2 Round Brisk 0       Visual Fields      Left Right    Full Full       Extraocular Movement      Right Left    Full Full       Neuro/Psych    Oriented x3: Yes   Mood/Affect: Normal       Dilation    Both eyes: 1.0% Mydriacyl, 2.5% Phenylephrine @ 8:38 AM        Slit Lamp and Fundus Exam    Slit Lamp Exam      Right Left   Lids/Lashes mild MGD  mild MGD   Conjunctiva/Sclera White and quiet White and quiet   Cornea trace PEE, mild Debris in tear film trace PEE   Anterior Chamber deep and clear, narrow temporal angle deep and clear, narrow temporal angle   Iris round and dilated round and dilated   Lens 1 + Nucleus sclerois, 1+ cortical 1 + Nucleus sclerois, 1+ cortical   Vitreous mild syneresis mild syneresis       Fundus Exam      Right Left   Disc compact, pink and sharp compact, pink and sharp   C/D Ratio 0.1 0.1   Macula Good foveal reflex, focal CNVM/PED with pigment clumping and stable resolution of focal surrounding SRF -- nasal to fovea, trace cystic changes overlying CNV - stably resolved, focal druse IT mac, No heme or edema flat, good foveal reflex, mild RPE mottling, no heme or edema   Vessels mild Vascular attenuation,  mild Tortuous, mild copper wiring mild Vascular attenuation,  mild Tortuous, mild copper wiring, mild A/V crossing changes   Periphery attached, no heme attached, no heme        Refraction    Wearing Rx      Sphere Cylinder Axis   Right  +2.50 +0.25 140   Left +1.50 +0.25 065          IMAGING  AND PROCEDURES  Imaging and Procedures for _0 @  OCT, Retina - OU - Both Eyes       Right Eye Quality was good. Central Foveal Thickness: 263. Progression has been stable. Findings include normal foveal contour, no IRF, no SRF, outer retinal atrophy, vitreomacular adhesion , choroidal neovascular membrane, pigment epithelial detachment, intraretinal hyper-reflective material (stable improvement in IRF/IRHM overlying CNVM nasal to fovea).   Left Eye Quality was good. Central Foveal Thickness: 285. Progression has been stable. Findings include normal foveal contour, no IRF, no SRF, vitreomacular adhesion .   Notes *Images captured and stored on drive  Diagnosis / Impression:  OD: stable improvement in IRF/IRHM overlying CNVM nasal to fovea  OS: NFP, no IRF/SRF, VMA  Clinical management:  See below  Abbreviations: NFP - Normal foveal profile. CME - cystoid macular edema. PED - pigment epithelial detachment. IRF - intraretinal fluid. SRF - subretinal fluid. EZ - ellipsoid zone. ERM - epiretinal membrane. ORA - outer retinal atrophy. ORT - outer retinal tubulation. SRHM - subretinal hyper-reflective material        Intravitreal Injection, Pharmacologic Agent - OD - Right Eye       Time Out 07/27/2020. 9:46 AM. Confirmed correct patient, procedure, site, and patient consented.   Anesthesia Topical anesthesia was used. Anesthetic medications included Lidocaine 2%, Proparacaine 0.5%.   Procedure Preparation included 5% betadine to ocular surface, eyelid speculum. A (32 g) needle was used.   Injection:  2 mg aflibercept Alfonse Flavors) SOLN   NDC: M7179715, Lot: [id number], Expiration date: 12/20/2020   Route: Intravitreal, Site: Right Eye, Waste: 0.05 mL  Post-op Post injection exam found visual acuity of at least counting fingers. The patient tolerated the procedure well. There were no complications. The patient  received written and verbal post procedure care education. Post injection medications were not given.   Notes **PAP MEDICATION ADMINISTERED**                ASSESSMENT/PLAN:    ICD-10-CM   1. 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 Exudative age-related macular degeneration of right eye with active choroidal neovascularization (HCC)  H35.3211 Intravitreal Injection, Pharmacologic Agent - OD - Right Eye    aflibercept (EYLEA) SOLN 2 mg  3. Retinal edema  H35.81 OCT, Retina - OU - Both Eyes  4. Essential hypertension  I10   5. Hypertensive retinopathy of both eyes  H35.033   6. Combined forms of age-related cataract of both eyes  H25.813   7. 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 Hypertensive retinopathy OU  - discussed importance of tight BP control  - monitor  6.  Mixed cataracts OU  - The  symptoms of cataract, surgical options, and treatments and risks were discussed with patient.  - discussed diagnosis and progression  - not yet visually significant  - monitor  Ophthalmic Meds Ordered this visit:  Meds ordered this encounter  Medications  . aflibercept (EYLEA) SOLN 2 mg      Return in about 6 weeks (around 09/07/2020) for f/u CNV OD, DFE, OCT.  There are no Patient Instructions on file for this visit.  This document serves as a record of services personally performed by Gardiner Sleeper, MD, PhD. It was created on their behalf by San Jetty. Owens Shark, OA an ophthalmic technician. The creation of this record is the provider's dictation and/or activities during the visit.    Electronically signed by: San Jetty. Owens Shark, OA 01.31.2022 10:16 AM   This document serves as a record of services personally performed by Gardiner Sleeper, MD, PhD. It was created on their behalf by San Jetty. Owens Shark, OA an ophthalmic technician. The creation of this record is the provider's dictation and/or activities during the visit.    Electronically signed by: San Jetty. Owens Shark, [place] 02.04.2022 10:16 AM   Gardiner Sleeper, M.D., Ph.D. Diseases & Surgery of the Retina and Vitreous [facility]  I have reviewed the above documentation for accuracy and completeness, and I agree with the above. Gardiner Sleeper, M.D., Ph.D. 07/27/20 10:16 AM    Abbreviations: M myopia (nearsighted); A astigmatism; H hyperopia (farsighted); P presbyopia; Mrx spectacle prescription;  CTL contact lenses; OD right eye; OS left eye; OU both eyes  XT exotropia; ET esotropia; PEK punctate epithelial keratitis; PEE punctate epithelial erosions; DES dry eye syndrome; MGD meibomian gland dysfunction; ATs artificial tears; PFAT's preservative free artificial tears; [place] nuclear sclerotic cataract; PSC posterior subcapsular cataract; ERM epi-retinal membrane; PVD posterior vitreous detachment; RD retinal detachment; DM  diabetes mellitus; DR diabetic retinopathy; NPDR non-proliferative diabetic retinopathy; PDR proliferative diabetic retinopathy; CSME clinically significant macular edema; DME diabetic macular edema; dbh dot blot hemorrhages; CWS cotton wool spot; POAG primary open angle glaucoma; C/D cup-to-disc ratio; HVF humphrey visual field; GVF goldmann visual field; OCT optical coherence tomography; IOP intraocular pressure; BRVO Branch retinal vein occlusion; CRVO central retinal vein occlusion; CRAO central retinal artery occlusion; BRAO branch retinal artery occlusion; RT retinal tear; SB scleral buckle; PPV pars plana vitrectomy; VH Vitreous hemorrhage; PRP panretinal laser photocoagulation; IVK intravitreal kenalog; VMT vitreomacular traction; MH Macular hole;  NVD neovascularization of the disc; NVE neovascularization elsewhere; AREDS age related eye disease study; ARMD age related macular degeneration; POAG primary open angle glaucoma; EBMD epithelial/anterior basement membrane dystrophy; ACIOL anterior chamber intraocular lens; IOL intraocular lens; PCIOL posterior chamber intraocular lens; Phaco/IOL phacoemulsification with intraocular lens placement; [place] photorefractive keratectomy; LASIK laser assisted in situ keratomileusis; HTN hypertension; DM diabetes mellitus; COPD chronic obstructive pulmonary disease

## 2020-07-27 ENCOUNTER — Other Ambulatory Visit: Payer: Self-pay

## 2020-07-27 ENCOUNTER — Encounter (INDEPENDENT_AMBULATORY_CARE_PROVIDER_SITE_OTHER): Payer: Self-pay | Admitting: Ophthalmology

## 2020-07-27 ENCOUNTER — Ambulatory Visit (INDEPENDENT_AMBULATORY_CARE_PROVIDER_SITE_OTHER): Payer: BC Managed Care – PPO | Admitting: Ophthalmology

## 2020-07-27 DIAGNOSIS — H3581 Retinal edema: Secondary | ICD-10-CM | POA: Diagnosis not present

## 2020-07-27 DIAGNOSIS — H353211 Exudative age-related macular degeneration, right eye, with active choroidal neovascularization: Secondary | ICD-10-CM

## 2020-07-27 DIAGNOSIS — H35033 Hypertensive retinopathy, bilateral: Secondary | ICD-10-CM

## 2020-07-27 DIAGNOSIS — H35711 Central serous chorioretinopathy, right eye: Secondary | ICD-10-CM

## 2020-07-27 DIAGNOSIS — I10 Essential (primary) hypertension: Secondary | ICD-10-CM

## 2020-07-27 DIAGNOSIS — H25813 Combined forms of age-related cataract, bilateral: Secondary | ICD-10-CM

## 2020-07-27 DIAGNOSIS — H35051 Retinal neovascularization, unspecified, right eye: Secondary | ICD-10-CM | POA: Diagnosis not present

## 2020-07-27 MED ORDER — AFLIBERCEPT 2MG/0.05ML IZ SOLN FOR KALEIDOSCOPE
2.0000 mg | INTRAVITREAL | Status: AC | PRN
  Administered 2020-07-27: 2 mg via INTRAVITREAL

## 2020-08-24 DIAGNOSIS — F411 Generalized anxiety disorder: Secondary | ICD-10-CM | POA: Diagnosis not present

## 2020-08-24 DIAGNOSIS — F331 Major depressive disorder, recurrent, moderate: Secondary | ICD-10-CM | POA: Diagnosis not present

## 2020-08-24 DIAGNOSIS — F439 Reaction to severe stress, unspecified: Secondary | ICD-10-CM | POA: Diagnosis not present

## 2020-08-31 DIAGNOSIS — H35051 Retinal neovascularization, unspecified, right eye: Secondary | ICD-10-CM | POA: Diagnosis not present

## 2020-08-31 DIAGNOSIS — H35361 Drusen (degenerative) of macula, right eye: Secondary | ICD-10-CM | POA: Diagnosis not present

## 2020-08-31 DIAGNOSIS — H35721 Serous detachment of retinal pigment epithelium, right eye: Secondary | ICD-10-CM | POA: Diagnosis not present

## 2020-08-31 DIAGNOSIS — H524 Presbyopia: Secondary | ICD-10-CM | POA: Diagnosis not present

## 2020-08-31 DIAGNOSIS — H43393 Other vitreous opacities, bilateral: Secondary | ICD-10-CM | POA: Diagnosis not present

## 2020-08-31 DIAGNOSIS — H2513 Age-related nuclear cataract, bilateral: Secondary | ICD-10-CM | POA: Diagnosis not present

## 2020-09-05 NOTE — Progress Notes (Addendum)
[facility] Note  09/07/2020     CHIEF COMPLAINT Patient presents for Retina Follow Up   HISTORY OF PRESENT ILLNESS: Tristan Thomas is a 50 y.o. male who presents to the clinic today for:   HPI    Retina Follow Up    Patient presents with  Other.  In right eye.  This started months ago.  Severity is mild.  Duration of 6 weeks.  Since onset it is stable.  I, the attending physician,  performed the HPI with the patient and updated documentation appropriately.          Comments    50 y/o male pt here for 6 wk f/u for CNV OD.  No change in [place] OU.  Denies pain, FOL, floaters.  Eyes are being bothered a lot by allergies, and OD tears a lot.  No gtts.       Last edited by Bernarda Caffey, MD on 09/07/2020  1:10 PM. (History)    pt states he saw Dr. Kathlen Mody and got a new glasses rx, pt states his anti-depressant caused his BP to spike to 180/102, so his dr switched him to a new medication and his BP is almost back to normal  Referring physician: Janie Morning, DO [place],  [place] [id number]  HISTORICAL INFORMATION:   Selected notes from the [facility] Referred by Dr. Martinique DeMarco for concern of worsening PED   CURRENT MEDICATIONS: No current outpatient medications on file. (Ophthalmic Drugs)   No current facility-administered medications for this visit. (Ophthalmic Drugs)   Current Outpatient Medications (Other)  Medication Sig  . benzonatate (TESSALON) 200 MG capsule Take 200 mg by mouth 3 (three) times daily as needed.  . busPIRone (BUSPAR) 5 MG tablet Take 5 mg by mouth 3 (three) times daily.  . fluticasone (FLONASE) 50 MCG/ACT nasal spray Place 1 spray into both nostrils daily as needed for allergies or rhinitis.  Marland Kitchen ibuprofen (ADVIL,MOTRIN) 600 MG tablet Take 1 tablet (600 mg total) by mouth every 6 (six) hours as needed.  . loratadine (CLARITIN) 10 MG tablet Take 10 mg by mouth daily.  Marland Kitchen omeprazole  (PRILOSEC) 20 MG capsule Take 20 mg by mouth 3 times/day as needed-between meals & bedtime.   Marland Kitchen oxyCODONE-acetaminophen (PERCOCET) 5-325 MG tablet Take 1 tablet by mouth every 4 (four) hours as needed. (Patient taking differently: Take 1 tablet by mouth every 4 (four) hours as needed.)  . oxyCODONE-acetaminophen (ROXICET) 5-325 MG tablet Take 1 tablet by mouth every 4 (four) hours as needed for severe pain.  Marland Kitchen PARoxetine (PAXIL) 10 MG tablet Take 10 mg by mouth daily.  . predniSONE (DELTASONE) 20 MG tablet Take 20 mg by mouth daily.  . SUMAtriptan (IMITREX) 100 MG tablet Take 100 mg by mouth every 2 (two) hours as needed for migraine. May repeat in 2 hours if headache persists or recurs.  . tamsulosin (FLOMAX) 0.4 MG CAPS capsule tamsulosin 0.4 mg capsule  . venlafaxine XR (EFFEXOR-XR) 150 MG 24 hr capsule Take 150 mg by mouth daily.  Marland Kitchen buPROPion (WELLBUTRIN) 75 MG tablet Take 75 mg by mouth 2 (two) times daily. (Patient not taking: Reported on 09/07/2020)  . Cetirizine HCl 10 MG CAPS Take 1 capsule (10 mg total) by mouth daily for 10 days.   No current facility-administered medications for this visit. (Other)      REVIEW OF SYSTEMS: ROS    Positive for: Gastrointestinal, Neurological, Musculoskeletal, Eyes,  Respiratory   Negative for: Constitutional, Skin, Genitourinary, HENT, Endocrine, Cardiovascular, Psychiatric, Allergic/Imm, Heme/Lymph   Last edited by Matthew Folks, COA on 09/07/2020  8:37 AM. (History)       ALLERGIES Allergies  Allergen Reactions  . Hydrocodone-Acetaminophen Other (See Comments)    Pt got cold-chills     PAST MEDICAL HISTORY Past Medical History:  Diagnosis Date  . Anxiety   . Arthritis   . Asthma    not diagnosed  . Cataract    Mixed OU  . GERD (gastroesophageal reflux disease)   . Hypertensive retinopathy    OU   Past Surgical History:  Procedure Laterality Date  . ORIF ANKLE FRACTURE Right 04/15/2017   Procedure: OPEN REDUCTION INTERNAL  FIXATION (ORIF) RIGHT ANKLE AND SYNDESMOSIS;  Surgeon: Newt Minion, MD;  Location: [facility];  Service: Orthopedics;  Laterality: Right;  . TOOTH EXTRACTION     9 removed    FAMILY HISTORY Family History  Problem Relation Age of Onset  . Heart disease Father   . Stroke Mother   . Hypertension Mother   . Hyperlipidemia Mother     SOCIAL HISTORY Social History   Tobacco Use  . Smoking status: Former Smoker    Years: 15.00  . Smokeless tobacco: Never Used  . Tobacco comment: quit 1998 ish  Vaping Use  . Vaping Use: Never used  Substance Use Topics  . Alcohol use: Yes    Comment: monthy maybe  . Drug use: No         OPHTHALMIC EXAM:  Base Eye Exam    Visual Acuity (Snellen - Linear)      Right Left   Dist cc 20/20 20/20   Correction: Glasses       Tonometry (Tonopen, 8:40 AM)      Right Left   Pressure 11 12       Pupils      Dark Light Shape React APD   Right 3 2 Round Brisk None   Left 3 2 Round Brisk None       Visual Fields (Counting fingers)      Left Right    Full Full       Extraocular Movement      Right Left    Full, Ortho Full, Ortho       Neuro/Psych    Oriented x3: Yes   Mood/Affect: Normal       Dilation    Both eyes: 1.0% Mydriacyl, 2.5% Phenylephrine @ 8:40 AM        Slit Lamp and Fundus Exam    Slit Lamp Exam      Right Left   Lids/Lashes mild MGD  mild MGD   Conjunctiva/Sclera White and quiet White and quiet   Cornea trace PEE, mild Debris in tear film trace PEE   Anterior Chamber deep and clear, narrow temporal angle deep and clear, narrow temporal angle   Iris round and dilated round and dilated   Lens 1 + Nucleus sclerois, 1+ cortical 1 + Nucleus sclerois, 1+ cortical   Vitreous mild syneresis mild syneresis       Fundus Exam      Right Left   Disc compact, pink and sharp compact, pink and sharp   C/D Ratio 0.1 0.1   Macula Good foveal reflex, focal CNVM/PED with pigment clumping and stable resolution of focal  surrounding SRF and IRF-- nasal to fovea, focal druse IT mac, No heme or edema flat, good foveal reflex,  mild RPE mottling, no heme or edema   Vessels mild Vascular attenuation,  mild Tortuous, mild copper wiring mild Vascular attenuation,  mild Tortuous, mild copper wiring, mild A/V crossing changes   Periphery attached, no heme attached, no heme          IMAGING AND PROCEDURES  Imaging and Procedures for _0 @  OCT, Retina - OU - Both Eyes       Right Eye Quality was good. Central Foveal Thickness: 265. Progression has improved. Findings include normal foveal contour, no IRF, no SRF, outer retinal atrophy, vitreomacular adhesion , choroidal neovascular membrane, pigment epithelial detachment, intraretinal hyper-reflective material (Stable improvement in IRF/IRHM overlying CNVM nasal to fovea, interval improvement in [facility] / CNV).   Left Eye Quality was good. Central Foveal Thickness: 286. Progression has been stable. Findings include normal foveal contour, no IRF, no SRF, vitreomacular adhesion .   Notes *Images captured and stored on drive  Diagnosis / Impression:  OD: Stable improvement in IRF/IRHM overlying CNVM nasal to fovea, interval improvement in [facility] / CNV OS: NFP, no IRF/SRF, VMA  Clinical management:  See below  Abbreviations: NFP - Normal foveal profile. CME - cystoid macular edema. PED - pigment epithelial detachment. IRF - intraretinal fluid. SRF - subretinal fluid. EZ - ellipsoid zone. ERM - epiretinal membrane. ORA - outer retinal atrophy. ORT - outer retinal tubulation. SRHM - subretinal hyper-reflective material        Intravitreal Injection, Pharmacologic Agent - OD - Right Eye       Time Out 09/07/2020. 9:10 AM. Confirmed correct patient, procedure, site, and patient consented.   Anesthesia Topical anesthesia was used. Anesthetic medications included Lidocaine 2%, Proparacaine 0.5%.   Procedure Preparation included 5% betadine to ocular surface,  eyelid speculum. A (32 g) needle was used.   Injection:  2 mg aflibercept Alfonse Flavors) SOLN   NDC: M7179715, Lot: [id number], Expiration date: 12/20/2020   Route: Intravitreal, Site: Right Eye, Waste: 0.05 mL  Post-op Post injection exam found visual acuity of at least counting fingers. The patient tolerated the procedure well. There were no complications. The patient received written and verbal post procedure care education. Post injection medications were not given.   Notes **PAP MEDICATION ADMINISTERED**                ASSESSMENT/PLAN:    ICD-10-CM   1. 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 Exudative age-related macular degeneration of right eye with active choroidal neovascularization ([place])  H35.3211   3. Retinal edema  H35.81 OCT, Retina - OU - Both Eyes  4. Essential hypertension  I10   5. Hypertensive retinopathy of both eyes  H35.033   6. 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 Hypertensive retinopathy OU  - discussed importance of tight BP control  - monitor  6.  Mixed cataracts OU  - The symptoms of cataract, surgical options, and treatments and risks were discussed with patient.  - discussed diagnosis and progression  - not yet visually significant  - monitor  Ophthalmic Meds Ordered this visit:  Meds ordered this encounter  Medications  . aflibercept (EYLEA) SOLN 2 mg      Return in about 8 weeks (around 11/02/2020) for f/u CNV OD, DFE, OCT.  There are no Patient Instructions on file for this visit.  This document serves as a record of services personally performed by Gardiner Sleeper, MD, PhD. It was created on their behalf by San Jetty. Owens Shark, OA an ophthalmic technician. The creation of this record is the provider's dictation and/or activities during the visit.    Electronically signed by: San Jetty. Owens Shark, [place] 03.16.2022 1:12 PM   Gardiner Sleeper, M.D., Ph.D. Diseases & Surgery of the Retina and Vitreous [employer]  I have reviewed the above documentation for accuracy and completeness, and I agree with the above. Gardiner Sleeper, M.D., Ph.D. 09/07/20 1:12 PM   Abbreviations: M myopia (nearsighted); A astigmatism; H hyperopia (farsighted); P presbyopia; Mrx spectacle prescription;  CTL contact lenses; OD right eye; OS left eye; OU both eyes  XT exotropia; ET esotropia; PEK punctate epithelial keratitis; PEE punctate epithelial erosions; DES dry eye syndrome; MGD meibomian gland dysfunction; ATs artificial tears; PFAT's preservative free artificial tears; [place] nuclear sclerotic cataract; PSC posterior subcapsular cataract; ERM epi-retinal membrane; PVD posterior vitreous detachment; RD retinal detachment; DM diabetes mellitus; DR diabetic retinopathy; NPDR non-proliferative diabetic retinopathy; PDR proliferative diabetic  retinopathy; CSME clinically significant macular edema; DME diabetic macular edema; dbh dot blot hemorrhages; CWS cotton wool spot; POAG primary open angle glaucoma; C/D cup-to-disc ratio; HVF humphrey visual field; GVF goldmann visual field; OCT optical coherence tomography; IOP intraocular pressure; BRVO Branch retinal vein occlusion; CRVO central retinal vein occlusion; CRAO central retinal artery occlusion; BRAO branch retinal artery occlusion; RT retinal tear; SB scleral buckle; PPV pars plana vitrectomy; VH Vitreous hemorrhage; PRP panretinal laser photocoagulation; IVK intravitreal kenalog; VMT vitreomacular traction; MH Macular hole;  NVD neovascularization of the disc; NVE neovascularization elsewhere; AREDS age related eye disease study; ARMD age related macular degeneration; POAG primary open angle glaucoma; EBMD epithelial/anterior basement membrane dystrophy; ACIOL anterior chamber intraocular lens; IOL intraocular lens; PCIOL posterior chamber intraocular lens; Phaco/IOL phacoemulsification with intraocular lens placement; [place] photorefractive keratectomy; LASIK laser assisted in situ keratomileusis; HTN hypertension; DM diabetes mellitus; COPD chronic obstructive pulmonary disease

## 2020-09-07 ENCOUNTER — Other Ambulatory Visit: Payer: Self-pay

## 2020-09-07 ENCOUNTER — Encounter (INDEPENDENT_AMBULATORY_CARE_PROVIDER_SITE_OTHER): Payer: Self-pay | Admitting: Ophthalmology

## 2020-09-07 ENCOUNTER — Ambulatory Visit (INDEPENDENT_AMBULATORY_CARE_PROVIDER_SITE_OTHER): Payer: BC Managed Care – PPO | Admitting: Ophthalmology

## 2020-09-07 DIAGNOSIS — H353211 Exudative age-related macular degeneration, right eye, with active choroidal neovascularization: Secondary | ICD-10-CM

## 2020-09-07 DIAGNOSIS — H3581 Retinal edema: Secondary | ICD-10-CM

## 2020-09-07 DIAGNOSIS — H35033 Hypertensive retinopathy, bilateral: Secondary | ICD-10-CM

## 2020-09-07 DIAGNOSIS — I10 Essential (primary) hypertension: Secondary | ICD-10-CM

## 2020-09-07 DIAGNOSIS — H25813 Combined forms of age-related cataract, bilateral: Secondary | ICD-10-CM

## 2020-09-07 DIAGNOSIS — H35711 Central serous chorioretinopathy, right eye: Secondary | ICD-10-CM

## 2020-09-07 DIAGNOSIS — H35051 Retinal neovascularization, unspecified, right eye: Secondary | ICD-10-CM

## 2020-09-07 MED ORDER — AFLIBERCEPT 2MG/0.05ML IZ SOLN FOR KALEIDOSCOPE
2.0000 mg | INTRAVITREAL | Status: AC | PRN
  Administered 2020-09-07: 2 mg via INTRAVITREAL

## 2020-10-31 NOTE — Progress Notes (Signed)
[facility] Note  11/02/2020     CHIEF COMPLAINT Patient presents for Retina Follow Up   HISTORY OF PRESENT ILLNESS: Tristan Thomas is a 50 y.o. male who presents to the clinic today for:   HPI    Retina Follow Up    Patient presents with  Other.  In right eye.  This started 8 weeks ago.  I, the attending physician,  performed the HPI with the patient and updated documentation appropriately.          Comments    Patient here for 8 weeks retina follow up for CNV OD. Patient states vision doing fine. No eye pain.        Last edited by Bernarda Caffey, MD on 11/02/2020  4:19 PM. (History)    pt states  Referring physician: Janie Morning, DO [place],  [place] [address]  HISTORICAL INFORMATION:   Selected notes from the MEDICAL RECORD NUMBER Referred by Dr. Martinique DeMarco for concern of worsening PED   CURRENT MEDICATIONS: No current outpatient medications on file. (Ophthalmic Drugs)   No current facility-administered medications for this visit. (Ophthalmic Drugs)   Current Outpatient Medications (Other)  Medication Sig  . benzonatate (TESSALON) 200 MG capsule Take 200 mg by mouth 3 (three) times daily as needed.  Marland Kitchen buPROPion (WELLBUTRIN) 75 MG tablet Take 75 mg by mouth 2 (two) times daily. (Patient not taking: Reported on 09/07/2020)  . busPIRone (BUSPAR) 5 MG tablet Take 5 mg by mouth 3 (three) times daily.  . Cetirizine HCl 10 MG CAPS Take 1 capsule (10 mg total) by mouth daily for 10 days.  . fluticasone (FLONASE) 50 MCG/ACT nasal spray Place 1 spray into both nostrils daily as needed for allergies or rhinitis.  Marland Kitchen ibuprofen (ADVIL,MOTRIN) 600 MG tablet Take 1 tablet (600 mg total) by mouth every 6 (six) hours as needed.  . loratadine (CLARITIN) 10 MG tablet Take 10 mg by mouth daily.  Marland Kitchen omeprazole (PRILOSEC) 20 MG capsule Take 20 mg by mouth 3 times/day as needed-between meals & bedtime.   Marland Kitchen  oxyCODONE-acetaminophen (PERCOCET) 5-325 MG tablet Take 1 tablet by mouth every 4 (four) hours as needed. (Patient taking differently: Take 1 tablet by mouth every 4 (four) hours as needed.)  . oxyCODONE-acetaminophen (ROXICET) 5-325 MG tablet Take 1 tablet by mouth every 4 (four) hours as needed for severe pain.  Marland Kitchen PARoxetine (PAXIL) 10 MG tablet Take 10 mg by mouth daily.  . predniSONE (DELTASONE) 20 MG tablet Take 20 mg by mouth daily.  . SUMAtriptan (IMITREX) 100 MG tablet Take 100 mg by mouth every 2 (two) hours as needed for migraine. May repeat in 2 hours if headache persists or recurs.  . tamsulosin (FLOMAX) 0.4 MG CAPS capsule tamsulosin 0.4 mg capsule  . venlafaxine XR (EFFEXOR-XR) 150 MG 24 hr capsule Take 150 mg by mouth daily.   No current facility-administered medications for this visit. (Other)      REVIEW OF SYSTEMS: ROS    Positive for: Gastrointestinal, Neurological, Musculoskeletal, Eyes, Respiratory   Negative for: Constitutional, Skin, Genitourinary, HENT, Endocrine, Cardiovascular, Psychiatric, Allergic/Imm, Heme/Lymph   Last edited by Theodore Demark, COA on 11/02/2020  8:49 AM. (History)       ALLERGIES Allergies  Allergen Reactions  . Hydrocodone-Acetaminophen Other (See Comments)    Pt got cold-chills     PAST MEDICAL HISTORY Past Medical History:  Diagnosis Date  . Anxiety   . Arthritis   .  Asthma    not diagnosed  . Cataract    Mixed OU  . GERD (gastroesophageal reflux disease)   . Hypertensive retinopathy    OU   Past Surgical History:  Procedure Laterality Date  . ORIF ANKLE FRACTURE Right 04/15/2017   Procedure: OPEN REDUCTION INTERNAL FIXATION (ORIF) RIGHT ANKLE AND SYNDESMOSIS;  Surgeon: Newt Minion, MD;  Location: [facility];  Service: Orthopedics;  Laterality: Right;  . TOOTH EXTRACTION     9 removed    FAMILY HISTORY Family History  Problem Relation Age of Onset  . Heart disease Father   . Stroke Mother   . Hypertension Mother    . Hyperlipidemia Mother     SOCIAL HISTORY Social History   Tobacco Use  . Smoking status: Former Smoker    Years: 15.00  . Smokeless tobacco: Never Used  . Tobacco comment: quit 1998 ish  Vaping Use  . Vaping Use: Never used  Substance Use Topics  . Alcohol use: Yes    Comment: monthy maybe  . Drug use: No         OPHTHALMIC EXAM:  Base Eye Exam    Visual Acuity (Snellen - Linear)      Right Left   Dist cc 20/20 20/20   Correction: Glasses       Tonometry (Tonopen, 8:46 AM)      Right Left   Pressure 10 10       Pupils      Dark Light Shape React APD   Right 3 2 Round Brisk None   Left 3 2 Round Brisk None       Visual Fields (Counting fingers)      Left Right    Full        Extraocular Movement      Right Left    Full, Ortho Full, Ortho       Neuro/Psych    Oriented x3: Yes   Mood/Affect: Normal       Dilation    Both eyes: 1.0% Mydriacyl, 2.5% Phenylephrine @ 8:46 AM        Slit Lamp and Fundus Exam    Slit Lamp Exam      Right Left   Lids/Lashes mild MGD  mild MGD   Conjunctiva/Sclera White and quiet White and quiet   Cornea trace PEE, mild Debris in tear film trace PEE   Anterior Chamber deep and clear, narrow temporal angle deep and clear, narrow temporal angle   Iris round and dilated round and dilated   Lens 1 + Nucleus sclerois, 1+ cortical 1 + Nucleus sclerois, 1+ cortical   Vitreous mild syneresis mild syneresis       Fundus Exam      Right Left   Disc compact, pink and sharp compact, pink and sharp   C/D Ratio 0.1 0.1   Macula Good foveal reflex, focal CNVM/PED with pigment clumping and stable resolution of focal surrounding SRF and IRF-- nasal to fovea, focal druse IT mac, No heme or edema flat, good foveal reflex, mild RPE mottling, no heme or edema   Vessels mild Vascular attenuation,  mild Tortuous, mild copper wiring mild Vascular attenuation,  mild Tortuous, mild copper wiring, mild A/V crossing changes   Periphery  attached, no heme attached, no heme        Refraction    Wearing Rx      Sphere Cylinder Axis   Right +2.50 +0.25 140   Left +1.50 +0.25 065  IMAGING AND PROCEDURES  Imaging and Procedures for _0 @  OCT, Retina - OU - Both Eyes       Right Eye Quality was good. Central Foveal Thickness: 262. Progression has improved. Findings include normal foveal contour, no IRF, no SRF, outer retinal atrophy, vitreomacular adhesion , choroidal neovascular membrane, pigment epithelial detachment, intraretinal hyper-reflective material (Stable improvement in IRF/IRHM overlying CNVM nasal to fovea, interval improvement in [facility] / CNV).   Left Eye Quality was good. Central Foveal Thickness: 283. Progression has been stable. Findings include normal foveal contour, no IRF, no SRF, vitreomacular adhesion .   Notes *Images captured and stored on drive  Diagnosis / Impression:  OD: Stable improvement in IRF/IRHM overlying CNVM nasal to fovea, interval improvement in [facility] / CNV OS: NFP, no IRF/SRF, VMA  Clinical management:  See below  Abbreviations: NFP - Normal foveal profile. CME - cystoid macular edema. PED - pigment epithelial detachment. IRF - intraretinal fluid. SRF - subretinal fluid. EZ - ellipsoid zone. ERM - epiretinal membrane. ORA - outer retinal atrophy. ORT - outer retinal tubulation. SRHM - subretinal hyper-reflective material        Intravitreal Injection, Pharmacologic Agent - OD - Right Eye       Time Out 11/02/2020. 9:30 AM. Confirmed correct patient, procedure, site, and patient consented.   Anesthesia Topical anesthesia was used. Anesthetic medications included Lidocaine 2%, Proparacaine 0.5%.   Procedure Preparation included 5% betadine to ocular surface, eyelid speculum. A (32g) needle was used.   Injection:  2 mg aflibercept Alfonse Flavors) SOLN   NDC: [id number], Lot: [id number], Expiration date: 06/21/2021   Route: Intravitreal, Site: Right Eye, Waste:  0.05 mL  Post-op Post injection exam found visual acuity of at least counting fingers. The patient tolerated the procedure well. There were no complications. The patient received written and verbal post procedure care education.                 ASSESSMENT/PLAN:    ICD-10-CM   1. 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 Exudative age-related macular degeneration of right eye with active choroidal neovascularization (HCC)  H35.3211 Intravitreal Injection, Pharmacologic Agent - OD - Right Eye    aflibercept (EYLEA) SOLN 2 mg  3. Retinal edema  H35.81 OCT, Retina - OU - Both Eyes  4. Essential hypertension  I10   5. Hypertensive retinopathy of both eyes  H35.033   6. 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  - s/p IVA OD #1 (04.16.21), #2 (05.14.21), #3 (06.16.21), #4 (07.23.21), #5 (08.20.21), #6 (09.24.21) -- IVA resistance             - S/P IVE OD #1 (10.25.21), #2 (11.22.21), #3 (12.21.21), #4 (02.04.22), #5 (03.18.22)  - good response to IVE OD  - OCT stable improvement in IRF/IRHM overlying CNVM nasal to fovea; interval improvement in [facility] / CNV at 8 wk interval  - FA (03.26.21) shows focal CNV with late leakage nasal to fovea corresponding to OCT  - BCVA 20/20 OD   - pt reports stable improvement in metamorphopsia  - Recommend IVE OD #6 today, 05.13.22 w/ ext to 10 wks  - RBA of procedure discussed, questions answered  - informed consent obtained  - IVA consent form signed and scanned on 04.16.21   - IVE consent form signed and scanned  on 10.25.21  - see procedure note  - [employer]yle[employer] benefits investigation started, 09.24.21 -- pt no longer receiving PAP, [employer]yle[employer] will cover balance  - f/u in 10 weeks -- DFE, OCT, possible injection, tx and ext as able  4,5. Hypertensive retinopathy OU  - discussed importance of tight BP  control  - monitor  6.  Mixed cataracts OU  - The symptoms of cataract, surgical options, and treatments and risks were discussed with patient.  - discussed diagnosis and progression  - not yet visually significant  - monitor  Ophthalmic Meds Ordered this visit:  Meds ordered this encounter  Medications  . aflibercept (EYLEA) SOLN 2 mg      Return in about 10 weeks (around 01/11/2021) for f/u CNV OD, DFE, OCT.  There are no Patient Instructions on file for this visit.  This document serves as a record of services personally performed by Gardiner Sleeper, MD, PhD. It was created on their behalf by Leonie Douglas, an ophthalmic technician. The creation of this record is the provider's dictation and/or activities during the visit.    Electronically signed by: Leonie Douglas COA, 11/02/20  4:21 PM   This document serves as a record of services personally performed by Gardiner Sleeper, MD, PhD. It was created on their behalf by San Jetty. Owens Shark, OA an ophthalmic technician. The creation of this record is the provider's dictation and/or activities during the visit.    Electronically signed by: San Jetty. Owens Shark, [place] 05.13.2022 4:21 PM  Gardiner Sleeper, M.D., Ph.D. Diseases & Surgery of the Retina and [place] 11/02/2020  I have reviewed the above documentation for accuracy and completeness, and I agree with the above. Gardiner Sleeper, M.D., Ph.D. 11/02/20 4:21 PM   Abbreviations: M myopia (nearsighted); A astigmatism; H hyperopia (farsighted); P presbyopia; Mrx spectacle prescription;  CTL contact lenses; OD right eye; OS left eye; OU both eyes  XT exotropia; ET esotropia; PEK punctate epithelial keratitis; PEE punctate epithelial erosions; DES dry eye syndrome; MGD meibomian gland dysfunction; ATs artificial tears; PFAT's preservative free artificial tears; [place] nuclear sclerotic cataract; PSC posterior subcapsular cataract; ERM epi-retinal membrane; PVD  posterior vitreous detachment; RD retinal detachment; DM diabetes mellitus; DR diabetic retinopathy; NPDR non-proliferative diabetic retinopathy; PDR proliferative diabetic retinopathy; CSME clinically significant macular edema; DME diabetic macular edema; dbh dot blot hemorrhages; CWS cotton wool spot; POAG primary open angle glaucoma; C/D cup-to-disc ratio; HVF humphrey visual field; GVF goldmann visual field; OCT optical coherence tomography; IOP intraocular pressure; BRVO Branch retinal vein occlusion; CRVO central retinal vein occlusion; CRAO central retinal artery occlusion; BRAO branch retinal artery occlusion; RT retinal tear; SB scleral buckle; PPV pars plana vitrectomy; VH Vitreous hemorrhage; PRP panretinal laser photocoagulation; IVK intravitreal kenalog; VMT vitreomacular traction; MH Macular hole;  NVD neovascularization of the disc; NVE neovascularization elsewhere; AREDS age related eye disease study; ARMD age related macular degeneration; POAG primary open angle glaucoma; EBMD epithelial/anterior basement membrane dystrophy; ACIOL anterior chamber intraocular lens; IOL intraocular lens; PCIOL posterior chamber intraocular lens; Phaco/IOL phacoemulsification with intraocular lens placement; [place] photorefractive keratectomy; LASIK laser assisted in situ keratomileusis; HTN hypertension; DM diabetes mellitus; COPD chronic obstructive pulmonary disease

## 2020-11-02 ENCOUNTER — Ambulatory Visit (INDEPENDENT_AMBULATORY_CARE_PROVIDER_SITE_OTHER): Payer: BC Managed Care – PPO | Admitting: Ophthalmology

## 2020-11-02 ENCOUNTER — Encounter (INDEPENDENT_AMBULATORY_CARE_PROVIDER_SITE_OTHER): Payer: Self-pay | Admitting: Ophthalmology

## 2020-11-02 DIAGNOSIS — H353211 Exudative age-related macular degeneration, right eye, with active choroidal neovascularization: Secondary | ICD-10-CM | POA: Diagnosis not present

## 2020-11-02 DIAGNOSIS — H35051 Retinal neovascularization, unspecified, right eye: Secondary | ICD-10-CM | POA: Diagnosis not present

## 2020-11-02 DIAGNOSIS — I10 Essential (primary) hypertension: Secondary | ICD-10-CM | POA: Diagnosis not present

## 2020-11-02 DIAGNOSIS — H25813 Combined forms of age-related cataract, bilateral: Secondary | ICD-10-CM

## 2020-11-02 DIAGNOSIS — H35033 Hypertensive retinopathy, bilateral: Secondary | ICD-10-CM

## 2020-11-02 DIAGNOSIS — H3581 Retinal edema: Secondary | ICD-10-CM

## 2020-11-02 MED ORDER — AFLIBERCEPT 2MG/0.05ML IZ SOLN FOR KALEIDOSCOPE
2.0000 mg | INTRAVITREAL | Status: AC | PRN
  Administered 2020-11-02: 2 mg via INTRAVITREAL

## 2020-12-21 DIAGNOSIS — R21 Rash and other nonspecific skin eruption: Secondary | ICD-10-CM | POA: Diagnosis not present

## 2021-01-08 NOTE — Progress Notes (Addendum)
[facility] Note  01/11/2021     CHIEF COMPLAINT Patient presents for Retina Follow Up   HISTORY OF PRESENT ILLNESS: Tristan Thomas is a 50 y.o. male who presents to the clinic today for:   HPI     Retina Follow Up   Patient presents with  Other.  In right eye.  Duration of 10 weeks.  Since onset it is stable.  I, the attending physician,  performed the HPI with the patient and updated documentation appropriately.        Comments   Pt here for 10 wk ret f/u CNV OD. Pt states vision is the same, no changes. No ocular pain or discomfort.       Last edited by Bernarda Caffey, MD on 01/11/2021  9:04 AM.     pt states vision is stable  Referring physician: Janie Morning, DO [place],  [place] [address]  HISTORICAL INFORMATION:   Selected notes from the MEDICAL RECORD NUMBER Referred by Dr. Martinique DeMarco for concern of worsening PED   CURRENT MEDICATIONS: No current outpatient medications on file. (Ophthalmic Drugs)   No current facility-administered medications for this visit. (Ophthalmic Drugs)   Current Outpatient Medications (Other)  Medication Sig   benzonatate (TESSALON) 200 MG capsule Take 200 mg by mouth 3 (three) times daily as needed.   buPROPion (WELLBUTRIN) 75 MG tablet Take 75 mg by mouth 2 (two) times daily. (Patient not taking: Reported on 09/07/2020)   busPIRone (BUSPAR) 5 MG tablet Take 5 mg by mouth 3 (three) times daily.   Cetirizine HCl 10 MG CAPS Take 1 capsule (10 mg total) by mouth daily for 10 days.   fluticasone (FLONASE) 50 MCG/ACT nasal spray Place 1 spray into both nostrils daily as needed for allergies or rhinitis.   ibuprofen (ADVIL,MOTRIN) 600 MG tablet Take 1 tablet (600 mg total) by mouth every 6 (six) hours as needed.   loratadine (CLARITIN) 10 MG tablet Take 10 mg by mouth daily.   omeprazole (PRILOSEC) 20 MG capsule Take 20 mg by mouth 3 times/day as needed-between meals & bedtime.     oxyCODONE-acetaminophen (PERCOCET) 5-325 MG tablet Take 1 tablet by mouth every 4 (four) hours as needed. (Patient taking differently: Take 1 tablet by mouth every 4 (four) hours as needed.)   oxyCODONE-acetaminophen (ROXICET) 5-325 MG tablet Take 1 tablet by mouth every 4 (four) hours as needed for severe pain.   PARoxetine (PAXIL) 10 MG tablet Take 10 mg by mouth daily.   predniSONE (DELTASONE) 20 MG tablet Take 20 mg by mouth daily.   SUMAtriptan (IMITREX) 100 MG tablet Take 100 mg by mouth every 2 (two) hours as needed for migraine. May repeat in 2 hours if headache persists or recurs.   tamsulosin (FLOMAX) 0.4 MG CAPS capsule tamsulosin 0.4 mg capsule   venlafaxine XR (EFFEXOR-XR) 150 MG 24 hr capsule Take 150 mg by mouth daily.   No current facility-administered medications for this visit. (Other)      REVIEW OF SYSTEMS: ROS   Positive for: Gastrointestinal, Neurological, Musculoskeletal, Eyes, Respiratory Negative for: Constitutional, Skin, Genitourinary, HENT, Endocrine, Cardiovascular, Psychiatric, Allergic/Imm, Heme/Lymph Last edited by Kingsley Spittle, COT on 01/11/2021  8:09 AM.        ALLERGIES Allergies  Allergen Reactions   Hydrocodone-Acetaminophen Other (See Comments)    Pt got cold-chills     PAST MEDICAL HISTORY Past Medical History:  Diagnosis Date   Anxiety  Arthritis    Asthma    not diagnosed   Cataract    Mixed OU   GERD (gastroesophageal reflux disease)    Hypertensive retinopathy    OU   Past Surgical History:  Procedure Laterality Date   ORIF ANKLE FRACTURE Right 04/15/2017   Procedure: OPEN REDUCTION INTERNAL FIXATION (ORIF) RIGHT ANKLE AND SYNDESMOSIS;  Surgeon: Newt Minion, MD;  Location: [facility];  Service: Orthopedics;  Laterality: Right;   TOOTH EXTRACTION     9 removed    FAMILY HISTORY Family History  Problem Relation Age of Onset   Heart disease Father    Stroke Mother    Hypertension Mother    Hyperlipidemia Mother      SOCIAL HISTORY Social History   Tobacco Use   Smoking status: Former    Years: 15.00    Types: Cigarettes   Smokeless tobacco: Never   Tobacco comments:    quit 1998 ish  Vaping Use   Vaping Use: Never used  Substance Use Topics   Alcohol use: Yes    Comment: monthy maybe   Drug use: No         OPHTHALMIC EXAM:  Base Eye Exam     Visual Acuity (Snellen - Linear)       Right Left   Dist cc 20/15 20/25 +2    Correction: Glasses         Tonometry (Tonopen, 8:17 AM)       Right Left   Pressure 13 12         Pupils       Dark Light Shape React APD   Right 3 2 Round Brisk None   Left 3 2 Round Brisk None         Visual Fields (Counting fingers)       Left Right    Full          Extraocular Movement       Right Left    Full, Ortho Full, Ortho         Neuro/Psych     Oriented x3: Yes   Mood/Affect: Normal         Dilation     Both eyes: 1.0% Mydriacyl, 2.5% Phenylephrine @ 8:17 AM           Slit Lamp and Fundus Exam     Slit Lamp Exam       Right Left   Lids/Lashes mild MGD  mild MGD   Conjunctiva/Sclera White and quiet White and quiet   Cornea trace PEE, mild Debris in tear film trace PEE   Anterior Chamber deep and clear, narrow temporal angle deep and clear, narrow temporal angle   Iris round and dilated round and dilated   Lens 1 + Nucleus sclerois, 1+ cortical 1 + Nucleus sclerois, 1+ cortical   Vitreous mild syneresis mild syneresis         Fundus Exam       Right Left   Disc compact, pink and sharp compact, pink and sharp   C/D Ratio 0.1 0.1   Macula Good foveal reflex, focal CNVM/PED with pigment clumping and stable resolution of focal surrounding SRF and IRF-- nasal to fovea, focal druse IT mac, No heme or edema flat, good foveal reflex, mild RPE mottling, no heme or edema   Vessels mild Vascular attenuation,  mild Tortuous, mild copper wiring mild Vascular attenuation,  mild Tortuous, mild copper  wiring, mild A/V crossing changes   Periphery  attached, no heme attached, no heme           Refraction     Wearing Rx       Sphere Cylinder Axis   Right +2.50 +0.25 140   Left +1.50 +0.25 065            IMAGING AND PROCEDURES  Imaging and Procedures for _0 @  OCT, Retina - OU - Both Eyes       Right Eye Quality was good. Central Foveal Thickness: 266. Progression has been stable. Findings include normal foveal contour, no IRF, no SRF, outer retinal atrophy, vitreomacular adhesion , choroidal neovascular membrane, pigment epithelial detachment, intraretinal hyper-reflective material (Stable improvement in IRF/IRHM overlying CNVM nasal to fovea).   Left Eye Quality was good. Central Foveal Thickness: 286. Progression has been stable. Findings include normal foveal contour, no IRF, no SRF, vitreomacular adhesion .   Notes *Images captured and stored on drive  Diagnosis / Impression:  OD: focal idiopathic CNV -- Stable improvement in IRF/IRHM overlying CNVM nasal to fovea OS: NFP, no IRF/SRF, VMA  Clinical management:  See below  Abbreviations: NFP - Normal foveal profile. CME - cystoid macular edema. PED - pigment epithelial detachment. IRF - intraretinal fluid. SRF - subretinal fluid. EZ - ellipsoid zone. ERM - epiretinal membrane. ORA - outer retinal atrophy. ORT - outer retinal tubulation. SRHM - subretinal hyper-reflective material      Intravitreal Injection, Pharmacologic Agent - OD - Right Eye       Time Out 01/11/2021. 9:07 AM. Confirmed correct patient, procedure, site, and patient consented.   Anesthesia Topical anesthesia was used. Anesthetic medications included Lidocaine 2%, Proparacaine 0.5%.   Procedure Preparation included 5% betadine to ocular surface, eyelid speculum. A (32g) needle was used.   Injection: 2 mg aflibercept 2 MG/0.05ML   Route: Intravitreal, Site: Right Eye   NDC: [id number], Lot: [id number], Expiration date: 10/20/2021,  Waste: 0.05 mL   Post-op Post injection exam found visual acuity of at least counting fingers. The patient tolerated the procedure well. There were no complications. The patient received written and verbal post procedure care education.            ASSESSMENT/PLAN:    ICD-10-CM   1. Choroidal neovascular membrane of right eye  H35.051 Intravitreal Injection, Pharmacologic Agent - OD - Right Eye    aflibercept (EYLEA) SOLN 2 mg    2. Exudative age-related macular degeneration of right eye with active choroidal neovascularization (HCC)  H35.3211 Intravitreal Injection, Pharmacologic Agent - OD - Right Eye    aflibercept (EYLEA) SOLN 2 mg    3. Retinal edema  H35.81 OCT, Retina - OU - Both Eyes    4. Essential hypertension  I10     5. Hypertensive retinopathy of both eyes  H35.033     6. 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  - s/p IVA OD #1 (04.16.21), #2 (05.14.21), #3 (06.16.21), #4 (07.23.21), #5 (08.20.21), #6 (09.24.21) -- IVA resistance             - S/P IVE OD #1 (10.25.21), #2 (11.22.21), #3 (12.21.21), #4 (02.04.22), #5 (03.18.22), #6 (05.13.22)  - good response to IVE OD  - FA (03.26.21) shows focal CNV with late leakage nasal to fovea corresponding to OCT  - OCT stable improvement in IRF/IRHM overlying CNVM nasal to fovea at 10 wks  - BCVA 20/15 OD   - pt reports stable  improvement in metamorphopsia  - Recommend IVE OD #7 today, 07.22.22 w/ ext to 12 wks  - RBA of procedure discussed, questions answered  - informed consent obtained  - IVA consent form signed and scanned on 04.16.21   - IVE consent form signed and scanned on 10.25.21  - see procedure note  - [employer] benefits investigation started, 09.24.21 -- pt no longer receiving PAP, [employer] will cover balance  - f/u in 12 weeks -- DFE, OCT, possible injection, tx and ext as able  4,5. Hypertensive retinopathy OU  - discussed importance of tight BP  control  - monitor  6.  Mixed cataracts OU  - The symptoms of cataract, surgical options, and treatments and risks were discussed with patient.  - discussed diagnosis and progression  - not yet visually significant  - monitor  Ophthalmic Meds Ordered this visit:  Meds ordered this encounter  Medications   aflibercept (EYLEA) SOLN 2 mg       Return in about 12 weeks (around 04/05/2021) for Dilated Exam, OCT, Possible Injxn.  There are no Patient Instructions on file for this visit.  This document serves as a record of services personally performed by Gardiner Sleeper, MD, PhD. It was created on their behalf by San Jetty. Owens Shark, OA an ophthalmic technician. The creation of this record is the provider's dictation and/or activities during the visit.    Electronically signed by: San Jetty. Owens Shark, [place] 07.19.2022 9:17 AM  Gardiner Sleeper, M.D., Ph.D. Diseases & Surgery of the Retina and Vitreous [facility]  I have reviewed the above documentation for accuracy and completeness, and I agree with the above. Gardiner Sleeper, M.D., Ph.D. 01/11/21 9:17 AM  Abbreviations: M myopia (nearsighted); A astigmatism; H hyperopia (farsighted); P presbyopia; Mrx spectacle prescription;  CTL contact lenses; OD right eye; OS left eye; OU both eyes  XT exotropia; ET esotropia; PEK punctate epithelial keratitis; PEE punctate epithelial erosions; DES dry eye syndrome; MGD meibomian gland dysfunction; ATs artificial tears; PFAT's preservative free artificial tears; [place] nuclear sclerotic cataract; PSC posterior subcapsular cataract; ERM epi-retinal membrane; PVD posterior vitreous detachment; RD retinal detachment; DM diabetes mellitus; DR diabetic retinopathy; NPDR non-proliferative diabetic retinopathy; PDR proliferative diabetic retinopathy; CSME clinically significant macular edema; DME diabetic macular edema; dbh dot blot hemorrhages; CWS cotton wool spot; POAG primary open angle glaucoma;  C/D cup-to-disc ratio; HVF humphrey visual field; GVF goldmann visual field; OCT optical coherence tomography; IOP intraocular pressure; BRVO Branch retinal vein occlusion; CRVO central retinal vein occlusion; CRAO central retinal artery occlusion; BRAO branch retinal artery occlusion; RT retinal tear; SB scleral buckle; PPV pars plana vitrectomy; VH Vitreous hemorrhage; PRP panretinal laser photocoagulation; IVK intravitreal kenalog; VMT vitreomacular traction; MH Macular hole;  NVD neovascularization of the disc; NVE neovascularization elsewhere; AREDS age related eye disease study; ARMD age related macular degeneration; POAG primary open angle glaucoma; EBMD epithelial/anterior basement membrane dystrophy; ACIOL anterior chamber intraocular lens; IOL intraocular lens; PCIOL posterior chamber intraocular lens; Phaco/IOL phacoemulsification with intraocular lens placement; [place] photorefractive keratectomy; LASIK laser assisted in situ keratomileusis; HTN hypertension; DM diabetes mellitus; COPD chronic obstructive pulmonary disease

## 2021-01-11 ENCOUNTER — Encounter (INDEPENDENT_AMBULATORY_CARE_PROVIDER_SITE_OTHER): Payer: Self-pay | Admitting: Ophthalmology

## 2021-01-11 ENCOUNTER — Other Ambulatory Visit: Payer: Self-pay

## 2021-01-11 ENCOUNTER — Ambulatory Visit (INDEPENDENT_AMBULATORY_CARE_PROVIDER_SITE_OTHER): Payer: BC Managed Care – PPO | Admitting: Ophthalmology

## 2021-01-11 DIAGNOSIS — H35051 Retinal neovascularization, unspecified, right eye: Secondary | ICD-10-CM | POA: Diagnosis not present

## 2021-01-11 DIAGNOSIS — H35711 Central serous chorioretinopathy, right eye: Secondary | ICD-10-CM

## 2021-01-11 DIAGNOSIS — H353211 Exudative age-related macular degeneration, right eye, with active choroidal neovascularization: Secondary | ICD-10-CM | POA: Diagnosis not present

## 2021-01-11 DIAGNOSIS — H3581 Retinal edema: Secondary | ICD-10-CM

## 2021-01-11 DIAGNOSIS — H25813 Combined forms of age-related cataract, bilateral: Secondary | ICD-10-CM

## 2021-01-11 DIAGNOSIS — I10 Essential (primary) hypertension: Secondary | ICD-10-CM | POA: Diagnosis not present

## 2021-01-11 DIAGNOSIS — H35033 Hypertensive retinopathy, bilateral: Secondary | ICD-10-CM

## 2021-01-11 MED ORDER — AFLIBERCEPT 2MG/0.05ML IZ SOLN FOR KALEIDOSCOPE
2.0000 mg | INTRAVITREAL | Status: AC | PRN
  Administered 2021-01-11: 2 mg via INTRAVITREAL

## 2021-01-12 DIAGNOSIS — F411 Generalized anxiety disorder: Secondary | ICD-10-CM | POA: Diagnosis not present

## 2021-01-12 DIAGNOSIS — F331 Major depressive disorder, recurrent, moderate: Secondary | ICD-10-CM | POA: Diagnosis not present

## 2021-02-08 DIAGNOSIS — F331 Major depressive disorder, recurrent, moderate: Secondary | ICD-10-CM | POA: Diagnosis not present

## 2021-02-08 DIAGNOSIS — F411 Generalized anxiety disorder: Secondary | ICD-10-CM | POA: Diagnosis not present

## 2021-04-02 NOTE — Progress Notes (Signed)
[facility] Note  04/05/2021     CHIEF COMPLAINT Patient presents for Retina Follow Up   HISTORY OF PRESENT ILLNESS: Tristan Thomas is a 50 y.o. male who presents to the clinic today for:   HPI     Retina Follow Up   Patient presents with  Other.  In right eye.  Duration of 12 weeks.  Since onset it is stable.  I, the attending physician,  performed the HPI with the patient and updated documentation appropriately.        Comments   12 week follow up CNV OD- Vision appears stable OU.  Eyes have been feeling tired lately.  He thinks from allergies.  Having a lot of sinus pressure.      Last edited by Bernarda Caffey, MD on 04/07/2021  2:18 AM.    pt states vision is stable, no new fol or floaters, the spot in his vision is no larger than before  Referring physician: Janie Morning, DO [place],  [place] [id number]  HISTORICAL INFORMATION:   Selected notes from the MEDICAL RECORD NUMBER Referred by Dr. Martinique DeMarco for concern of worsening PED   CURRENT MEDICATIONS: No current outpatient medications on file. (Ophthalmic Drugs)   No current facility-administered medications for this visit. (Ophthalmic Drugs)   Current Outpatient Medications (Other)  Medication Sig   busPIRone (BUSPAR) 5 MG tablet Take 5 mg by mouth 3 (three) times daily.   fluticasone (FLONASE) 50 MCG/ACT nasal spray Place 1 spray into both nostrils daily as needed for allergies or rhinitis.   ibuprofen (ADVIL,MOTRIN) 600 MG tablet Take 1 tablet (600 mg total) by mouth every 6 (six) hours as needed.   loratadine (CLARITIN) 10 MG tablet Take 10 mg by mouth daily.   omeprazole (PRILOSEC) 20 MG capsule Take 20 mg by mouth 3 times/day as needed-between meals & bedtime.    PARoxetine (PAXIL) 10 MG tablet Take 10 mg by mouth daily.   predniSONE (DELTASONE) 20 MG tablet Take 20 mg by mouth daily.   SUMAtriptan (IMITREX) 100 MG tablet Take 100 mg by mouth  every 2 (two) hours as needed for migraine. May repeat in 2 hours if headache persists or recurs.   tamsulosin (FLOMAX) 0.4 MG CAPS capsule tamsulosin 0.4 mg capsule   venlafaxine XR (EFFEXOR-XR) 150 MG 24 hr capsule Take 150 mg by mouth daily.   benzonatate (TESSALON) 200 MG capsule Take 200 mg by mouth 3 (three) times daily as needed. (Patient not taking: Reported on 04/05/2021)   buPROPion (WELLBUTRIN) 75 MG tablet Take 75 mg by mouth 2 (two) times daily. (Patient not taking: No sig reported)   Cetirizine HCl 10 MG CAPS Take 1 capsule (10 mg total) by mouth daily for 10 days.   oxyCODONE-acetaminophen (PERCOCET) 5-325 MG tablet Take 1 tablet by mouth every 4 (four) hours as needed. (Patient not taking: Reported on 04/05/2021)   oxyCODONE-acetaminophen (ROXICET) 5-325 MG tablet Take 1 tablet by mouth every 4 (four) hours as needed for severe pain. (Patient not taking: Reported on 04/05/2021)   No current facility-administered medications for this visit. (Other)   REVIEW OF SYSTEMS: ROS   Positive for: Gastrointestinal, Neurological, Musculoskeletal, Eyes, Respiratory Negative for: Constitutional, Skin, Genitourinary, HENT, Endocrine, Cardiovascular, Psychiatric, Allergic/Imm, Heme/Lymph Last edited by Leonie Douglas, COA on 04/05/2021  7:51 AM.    ALLERGIES Allergies  Allergen Reactions   Hydrocodone-Acetaminophen Other (See Comments)    Pt got cold-chills  PAST MEDICAL HISTORY Past Medical History:  Diagnosis Date   Anxiety    Arthritis    Asthma    not diagnosed   Cataract    Mixed OU   GERD (gastroesophageal reflux disease)    Hypertensive retinopathy    OU   Past Surgical History:  Procedure Laterality Date   ORIF ANKLE FRACTURE Right 04/15/2017   Procedure: OPEN REDUCTION INTERNAL FIXATION (ORIF) RIGHT ANKLE AND SYNDESMOSIS;  Surgeon: Newt Minion, MD;  Location: [facility];  Service: Orthopedics;  Laterality: Right;   TOOTH EXTRACTION     9 removed    FAMILY  HISTORY Family History  Problem Relation Age of Onset   Heart disease Father    Stroke Mother    Hypertension Mother    Hyperlipidemia Mother     SOCIAL HISTORY Social History   Tobacco Use   Smoking status: Former    Years: 15.00    Types: Cigarettes   Smokeless tobacco: Never   Tobacco comments:    quit 1998 ish  Vaping Use   Vaping Use: Never used  Substance Use Topics   Alcohol use: Yes    Comment: monthy maybe   Drug use: No       OPHTHALMIC EXAM: Base Eye Exam     Visual Acuity (Snellen - Linear)       Right Left   Dist cc 20/20 20/20 -1         Tonometry (Tonopen, 7:56 AM)       Right Left   Pressure 13 15         Pupils       Dark Light Shape React APD   Right 3 2 Round Minimal None   Left 3 2 Round Minimal None         Visual Fields (Counting fingers)       Left Right    Full Full         Extraocular Movement       Right Left    Full Full         Neuro/Psych     Oriented x3: Yes   Mood/Affect: Normal         Dilation     Both eyes: 1.0% Mydriacyl, 2.5% Phenylephrine @ 7:56 AM           Slit Lamp and Fundus Exam     Slit Lamp Exam       Right Left   Lids/Lashes mild MGD  mild MGD   Conjunctiva/Sclera White and quiet White and quiet   Cornea trace PEE, mild Debris in tear film trace PEE   Anterior Chamber deep and clear, narrow temporal angle deep and clear, narrow temporal angle   Iris round and dilated round and dilated   Lens 1 + Nucleus sclerois, 1+ cortical 1 + Nucleus sclerois, 1+ cortical   Vitreous mild syneresis mild syneresis         Fundus Exam       Right Left   Disc compact, pink and sharp compact, pink and sharp   C/D Ratio 0.1 0.1   Macula flat, good foveal reflex, focal CNVM/PED with focal pigment clumping centrally and stable resolution of focal surrounding SRF and IRF-- nasal to fovea, focal druse IT mac, No heme or edema flat, good foveal reflex, mild RPE mottling, no heme or edema    Vessels mild Vascular attenuation,  mild Tortuousity, mild copper wiring mild Vascular attenuation,  mild Tortuous, mild copper  wiring, mild A/V crossing changes   Periphery attached, no heme attached, no heme           Refraction     Wearing Rx       Sphere Cylinder Axis   Right +2.50 +0.25 140   Left +1.50 +0.25 065            IMAGING AND PROCEDURES  Imaging and Procedures for _0 @  OCT, Retina - OU - Both Eyes       Right Eye Quality was good. Central Foveal Thickness: 263. Progression has been stable. Findings include normal foveal contour, no IRF, no SRF, outer retinal atrophy, vitreomacular adhesion , choroidal neovascular membrane, pigment epithelial detachment, intraretinal hyper-reflective material (Stable improvement in IRF overlying CNVM nasal to fovea, persistent IRHM).   Left Eye Quality was good. Central Foveal Thickness: 284. Progression has been stable. Findings include normal foveal contour, no IRF, no SRF, vitreomacular adhesion .   Notes *Images captured and stored on drive  Diagnosis / Impression:  OD: focal idiopathic CNV -- Stable improvement in IRF overlying CNVM nasal to fovea, persistent IRHM OS: NFP, no IRF/SRF, VMA  Clinical management:  See below  Abbreviations: NFP - Normal foveal profile. CME - cystoid macular edema. PED - pigment epithelial detachment. IRF - intraretinal fluid. SRF - subretinal fluid. EZ - ellipsoid zone. ERM - epiretinal membrane. ORA - outer retinal atrophy. ORT - outer retinal tubulation. SRHM - subretinal hyper-reflective material            ASSESSMENT/PLAN:    ICD-10-CM   1. 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 Exudative age-related macular degeneration of right eye with active choroidal neovascularization ([place])  H35.3211     3. Retinal edema  H35.81 OCT, Retina - OU - Both Eyes    4. Essential hypertension  I10     5. Hypertensive retinopathy of both eyes  H35.033     6.  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 Hypertensive retinopathy OU  - discussed importance of tight BP control  - monitor   6.  Mixed cataracts OU  - The symptoms of cataract, surgical options, and treatments and risks were discussed with patient.  - discussed diagnosis and progression  - not yet visually significant  - monitor   Ophthalmic Meds Ordered this visit:  No orders of the defined types were placed in this encounter.      Return in about 12 weeks (around 06/28/2021) for f/u CNV OD, DFE, OCT.  There are no Patient Instructions on file for this  visit.  This document serves as a record of services personally performed by Gardiner Sleeper, MD, PhD. It was created on their behalf by Leonie Douglas, an ophthalmic technician. The creation of this record is the provider's dictation and/or activities during the visit.    Electronically signed by: Leonie Douglas COA, 04/07/21  2:19 AM  This document serves as a record of services personally performed by Gardiner Sleeper, MD, PhD. It was created on their behalf by San Jetty. Owens Shark, OA an ophthalmic technician. The creation of this record is the provider's  dictation and/or activities during the visit.    Electronically signed by: San Jetty. Owens Shark, [place] 10.14.2022 2:19 AM  Gardiner Sleeper, M.D., Ph.D. Diseases & Surgery of the Retina and [place] 04/05/2021  I have reviewed the above documentation for accuracy and completeness, and I agree with the above. Gardiner Sleeper, M.D., Ph.D. 04/07/21 2:21 AM  Abbreviations: M myopia (nearsighted); A astigmatism; H hyperopia (farsighted); P presbyopia; Mrx spectacle prescription;  CTL contact lenses; OD right eye; OS left eye; OU both eyes  XT exotropia; ET esotropia; PEK punctate epithelial keratitis; PEE punctate epithelial erosions; DES dry eye syndrome; MGD meibomian gland dysfunction; ATs artificial tears; PFAT's preservative free artificial tears; [place] nuclear sclerotic cataract; PSC posterior subcapsular cataract; ERM epi-retinal membrane; PVD posterior vitreous detachment; RD retinal detachment; DM diabetes mellitus; DR diabetic retinopathy; NPDR non-proliferative diabetic retinopathy; PDR proliferative diabetic retinopathy; CSME clinically significant macular edema; DME diabetic macular edema; dbh dot blot hemorrhages; CWS cotton wool spot; POAG primary open angle glaucoma; C/D cup-to-disc ratio; HVF humphrey visual field; GVF goldmann visual field; OCT optical coherence tomography; IOP intraocular pressure; BRVO Branch retinal vein occlusion; CRVO central retinal vein occlusion; CRAO central retinal artery occlusion; BRAO branch retinal artery occlusion; RT retinal tear; SB scleral buckle; PPV pars plana vitrectomy; VH Vitreous hemorrhage; PRP panretinal laser photocoagulation; IVK intravitreal kenalog; VMT vitreomacular traction; MH Macular hole;  NVD neovascularization of the disc; NVE neovascularization elsewhere; AREDS age related eye disease study; ARMD age related macular degeneration; POAG primary open angle glaucoma; EBMD epithelial/anterior basement membrane  dystrophy; ACIOL anterior chamber intraocular lens; IOL intraocular lens; PCIOL posterior chamber intraocular lens; Phaco/IOL phacoemulsification with intraocular lens placement; [place] photorefractive keratectomy; LASIK laser assisted in situ keratomileusis; HTN hypertension; DM diabetes mellitus; COPD chronic obstructive pulmonary disease

## 2021-04-05 ENCOUNTER — Other Ambulatory Visit: Payer: Self-pay

## 2021-04-05 ENCOUNTER — Ambulatory Visit (INDEPENDENT_AMBULATORY_CARE_PROVIDER_SITE_OTHER): Payer: BC Managed Care – PPO | Admitting: Ophthalmology

## 2021-04-05 DIAGNOSIS — H353211 Exudative age-related macular degeneration, right eye, with active choroidal neovascularization: Secondary | ICD-10-CM

## 2021-04-05 DIAGNOSIS — I10 Essential (primary) hypertension: Secondary | ICD-10-CM

## 2021-04-05 DIAGNOSIS — H35051 Retinal neovascularization, unspecified, right eye: Secondary | ICD-10-CM | POA: Diagnosis not present

## 2021-04-05 DIAGNOSIS — H35033 Hypertensive retinopathy, bilateral: Secondary | ICD-10-CM | POA: Diagnosis not present

## 2021-04-05 DIAGNOSIS — H3581 Retinal edema: Secondary | ICD-10-CM

## 2021-04-05 DIAGNOSIS — H25813 Combined forms of age-related cataract, bilateral: Secondary | ICD-10-CM

## 2021-04-07 ENCOUNTER — Encounter (INDEPENDENT_AMBULATORY_CARE_PROVIDER_SITE_OTHER): Payer: Self-pay | Admitting: Ophthalmology

## 2021-04-12 DIAGNOSIS — R7309 Other abnormal glucose: Secondary | ICD-10-CM | POA: Diagnosis not present

## 2021-04-12 DIAGNOSIS — E785 Hyperlipidemia, unspecified: Secondary | ICD-10-CM | POA: Diagnosis not present

## 2021-04-12 DIAGNOSIS — Z125 Encounter for screening for malignant neoplasm of prostate: Secondary | ICD-10-CM | POA: Diagnosis not present

## 2021-04-12 DIAGNOSIS — R946 Abnormal results of thyroid function studies: Secondary | ICD-10-CM | POA: Diagnosis not present

## 2021-04-12 DIAGNOSIS — Z0000 Encounter for general adult medical examination without abnormal findings: Secondary | ICD-10-CM | POA: Diagnosis not present

## 2021-04-19 DIAGNOSIS — Z125 Encounter for screening for malignant neoplasm of prostate: Secondary | ICD-10-CM | POA: Diagnosis not present

## 2021-04-19 DIAGNOSIS — N400 Enlarged prostate without lower urinary tract symptoms: Secondary | ICD-10-CM | POA: Diagnosis not present

## 2021-04-19 DIAGNOSIS — Z0000 Encounter for general adult medical examination without abnormal findings: Secondary | ICD-10-CM | POA: Diagnosis not present

## 2021-04-19 DIAGNOSIS — R252 Cramp and spasm: Secondary | ICD-10-CM | POA: Diagnosis not present

## 2021-05-24 DIAGNOSIS — F331 Major depressive disorder, recurrent, moderate: Secondary | ICD-10-CM | POA: Diagnosis not present

## 2021-05-24 DIAGNOSIS — F411 Generalized anxiety disorder: Secondary | ICD-10-CM | POA: Diagnosis not present

## 2021-06-20 NOTE — Progress Notes (Signed)
[facility] Note  06/28/2021     CHIEF COMPLAINT Patient presents for Retina Follow Up    HISTORY OF PRESENT ILLNESS: Tristan Thomas is a 50 y.o. male who presents to the clinic today for:   HPI     Retina Follow Up   Patient presents with  Other.  In right eye.  This started years ago.  Severity is mild.  Duration of 12 weeks.  Since onset it is stable.  I, the attending physician,  performed the HPI with the patient and updated documentation appropriately.        Comments   50 y/o male pt here for 12 wk f/u for idiopathic CNV OD.  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  Denies pain, FOL, floaters.  No gtts.      Last edited by Bernarda Caffey, MD on 06/28/2021  8:58 AM.     pt states vision is stable, no new fol or floaters, the spot in his vision is no larger than before  Referring physician: Janie Morning, DO [place],  [place] [id number]  HISTORICAL INFORMATION:   Selected notes from the MEDICAL RECORD NUMBER Referred by Dr. Martinique DeMarco for concern of worsening PED   CURRENT MEDICATIONS: No current outpatient medications on file. (Ophthalmic Drugs)   No current facility-administered medications for this visit. (Ophthalmic Drugs)   Current Outpatient Medications (Other)  Medication Sig   benzonatate (TESSALON) 200 MG capsule Take 200 mg by mouth 3 (three) times daily as needed.   buPROPion (WELLBUTRIN) 75 MG tablet Take 75 mg by mouth 2 (two) times daily.   busPIRone (BUSPAR) 10 MG tablet 1 tablet   busPIRone (BUSPAR) 5 MG tablet Take 5 mg by mouth 3 (three) times daily.   cyclobenzaprine (FLEXERIL) 5 MG tablet 1-2 tablets at bedtime as needed   esomeprazole (NEXIUM) 40 MG capsule 1 capsule   fluticasone (FLONASE) 50 MCG/ACT nasal spray Place 1 spray into both nostrils  daily as needed for allergies or rhinitis.   fluticasone (FLONASE) 50 MCG/ACT nasal spray 2 sprays in each nostril   ibuprofen (ADVIL,MOTRIN) 600 MG tablet Take 1 tablet (600 mg total) by mouth every 6 (six) hours as needed.   loratadine (CLARITIN) 10 MG tablet Take 10 mg by mouth daily.   omeprazole (PRILOSEC) 20 MG capsule Take 20 mg by mouth 3 times/day as needed-between meals & bedtime.    oxyCODONE-acetaminophen (PERCOCET) 5-325 MG tablet Take 1 tablet by mouth every 4 (four) hours as needed.   oxyCODONE-acetaminophen (ROXICET) 5-325 MG tablet Take 1 tablet by mouth every 4 (four) hours as needed for severe pain.   PARoxetine (PAXIL) 10 MG tablet Take 10 mg by mouth daily.   predniSONE (DELTASONE) 20 MG tablet Take 20 mg by mouth daily.   promethazine-phenylephrine (PROMETHAZINE VC) 6.25-5 MG/5ML SYRP 5 ml as needed   Red Yeast Rice 600 MG TABS 1 tablet   SUMAtriptan (IMITREX) 100 MG tablet Take 100 mg by mouth every 2 (two) hours as needed for migraine. May repeat in 2 hours if headache persists or recurs.   SUMAtriptan (IMITREX) 100 MG tablet 1 tablet as needed   tamsulosin (FLOMAX) 0.4 MG CAPS capsule tamsulosin 0.4 mg capsule   traMADol (ULTRAM) 50 MG tablet 1-2 tablets as needed   venlafaxine  XR (EFFEXOR-XR) 150 MG 24 hr capsule Take 150 mg by mouth daily.   Cetirizine HCl 10 MG CAPS Take 1 capsule (10 mg total) by mouth daily for 10 days.   loratadine (CLARITIN) 10 MG tablet 1 tablet (Patient not taking: Reported on 06/28/2021)   No current facility-administered medications for this visit. (Other)   REVIEW OF SYSTEMS: ROS   Positive for: Gastrointestinal, Neurological, Musculoskeletal, Eyes, Respiratory Negative for: Constitutional, Skin, Genitourinary, HENT, Endocrine, Cardiovascular, Psychiatric, Allergic/Imm, Heme/Lymph Last edited by Matthew Folks, COA on 06/28/2021  8:16 AM.     ALLERGIES Allergies  Allergen Reactions   Hydrocodone-Acetaminophen Other (See Comments)     Pt got cold-chills     PAST MEDICAL HISTORY Past Medical History:  Diagnosis Date   Anxiety    Arthritis    Asthma    not diagnosed   Cataract    Mixed OU   GERD (gastroesophageal reflux disease)    Hypertensive retinopathy    OU   Past Surgical History:  Procedure Laterality Date   ORIF ANKLE FRACTURE Right 04/15/2017   Procedure: OPEN REDUCTION INTERNAL FIXATION (ORIF) RIGHT ANKLE AND SYNDESMOSIS;  Surgeon: Newt Minion, MD;  Location: [facility];  Service: Orthopedics;  Laterality: Right;   TOOTH EXTRACTION     9 removed    FAMILY HISTORY Family History  Problem Relation Age of Onset   Heart disease Father    Stroke Mother    Hypertension Mother    Hyperlipidemia Mother     SOCIAL HISTORY Social History   Tobacco Use   Smoking status: Former    Years: 15.00    Types: Cigarettes   Smokeless tobacco: Never   Tobacco comments:    quit 1998 ish  Vaping Use   Vaping Use: Never used  Substance Use Topics   Alcohol use: Yes    Comment: monthy maybe   Drug use: No       OPHTHALMIC EXAM: Base Eye Exam     Visual Acuity (Snellen - Linear)       Right Left   Dist cc 20/20 20/25 -2   Dist ph cc  NI    Correction: Glasses         Tonometry (Tonopen, 8:19 AM)       Right Left   Pressure 11 10         Pupils       Dark Light Shape React APD   Right 3 2 Round Minimal None   Left 3 2 Round Minimal None         Visual Fields (Counting fingers)       Left Right    Full Full         Extraocular Movement       Right Left    Full, Ortho Full, Ortho         Neuro/Psych     Oriented x3: Yes   Mood/Affect: Normal         Dilation     Both eyes: 1.0% Mydriacyl, 2.5% Phenylephrine @ 8:19 AM           Slit Lamp and Fundus Exam     Slit Lamp Exam       Right Left   Lids/Lashes mild MGD  mild MGD   Conjunctiva/Sclera White and quiet White and quiet   Cornea mild Debris in tear film Mild, tear film debris   Anterior Chamber  deep and clear, narrow temporal angle deep and clear,  narrow temporal angle   Iris round and dilated round and dilated   Lens 1 + Nucleus sclerois, 1+ cortical 1 + Nucleus sclerois, 1+ cortical   Anterior Vitreous mild syneresis mild syneresis         Fundus Exam       Right Left   Disc compact, pink and sharp, mild PPA compact, pink and sharp   C/D Ratio 0.1 0.1   Macula flat, good foveal reflex, focal CNVM/PED with focal pigment clumping centrally and stable resolution of focal surrounding SRF and IRF-- nasal to fovea, focal druse IT mac, No heme or edema flat, good foveal reflex, mild RPE mottling, no heme or edema   Vessels mild Vascular attenuation, mild Tortuousity, mild copper wiring mild Vascular attenuation, mild Tortuousity, mild copper wiring, mild A/V crossing changes   Periphery attached, no heme attached, no heme            IMAGING AND PROCEDURES  Imaging and Procedures for _0 @  OCT, Retina - OU - Both Eyes       Right Eye Quality was good. Central Foveal Thickness: 269. Progression has been stable. Findings include normal foveal contour, no IRF, no SRF, outer retinal atrophy, vitreomacular adhesion , choroidal neovascular membrane, pigment epithelial detachment, intraretinal hyper-reflective material (Stable improvement in IRF overlying CNVM nasal to fovea, persistent IRHM).   Left Eye Quality was good. Central Foveal Thickness: 286. Progression has been stable. Findings include normal foveal contour, no IRF, no SRF, vitreomacular adhesion .   Notes *Images captured and stored on drive  Diagnosis / Impression:  OD: focal idiopathic CNV -- Stable improvement in IRF/SRF overlying CNVM nasal to fovea, persistent IRHM OS: NFP, no IRF/SRF, VMA  Clinical management:  See below  Abbreviations: NFP - Normal foveal profile. CME - cystoid macular edema. PED - pigment epithelial detachment. IRF - intraretinal fluid. SRF - subretinal fluid. EZ - ellipsoid zone. ERM  - epiretinal membrane. ORA - outer retinal atrophy. ORT - outer retinal tubulation. SRHM - subretinal hyper-reflective material            ASSESSMENT/PLAN:    ICD-10-CM   1. 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 Exudative age-related macular degeneration of right eye with active choroidal neovascularization ([place])  H35.3211     3. Essential hypertension  I10     4. Hypertensive retinopathy of both eyes  H35.033     5. Combined forms of age-related cataract of both eyes  H25.813      1,2. 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 Hypertensive retinopathy OU  - discussed importance of tight BP  control  - monitor    5.  Mixed cataracts OU  - The symptoms of cataract, surgical options, and treatments and risks were discussed with patient.  - discussed diagnosis and progression  - not yet visually significant  - monitor   Ophthalmic Meds Ordered this visit:  No orders of the defined types were placed in this encounter.    Return in about 6 months (around 12/26/2021) for f/u idiopathic CNV OD, DFE, OCT.  There are no Patient Instructions on file for this visit.  This document serves as a record of services personally  performed by Gardiner Sleeper, MD, PhD. It was created on their behalf by Leonie Douglas, an ophthalmic technician. The creation of this record is the provider's dictation and/or activities during the visit.    Electronically signed by: Leonie Douglas COA, 06/28/21  9:02 AM  This document serves as a record of services personally performed by Gardiner Sleeper, MD, PhD. It was created on their behalf by San Jetty. Owens Shark, OA an ophthalmic technician. The creation of this record is the provider's dictation and/or activities during the visit.    Electronically signed by: San Jetty. Owens Shark, [place] 01.06.2023 9:02 AM  Gardiner Sleeper, M.D., Ph.D. Diseases & Surgery of the Retina and [place] 06/28/2021  I have reviewed the above documentation for accuracy and completeness, and I agree with the above. Gardiner Sleeper, M.D., Ph.D. 06/28/21 9:02 AM  Abbreviations: M myopia (nearsighted); A astigmatism; H hyperopia (farsighted); P presbyopia; Mrx spectacle prescription;  CTL contact lenses; OD right eye; OS left eye; OU both eyes  XT exotropia; ET esotropia; PEK punctate epithelial keratitis; PEE punctate epithelial erosions; DES dry eye syndrome; MGD meibomian gland dysfunction; ATs artificial tears; PFAT's preservative free artificial tears; [place] nuclear sclerotic cataract; PSC posterior subcapsular cataract; ERM epi-retinal membrane; PVD posterior vitreous detachment; RD retinal detachment; DM diabetes mellitus; DR diabetic retinopathy; NPDR non-proliferative diabetic retinopathy; PDR proliferative diabetic retinopathy; CSME clinically significant macular edema; DME diabetic macular edema; dbh dot blot hemorrhages; CWS cotton wool spot; POAG primary open angle glaucoma; C/D cup-to-disc ratio; HVF humphrey visual field; GVF goldmann visual field; OCT optical coherence tomography; IOP intraocular pressure; BRVO Branch retinal vein occlusion; CRVO central retinal vein occlusion; CRAO central  retinal artery occlusion; BRAO branch retinal artery occlusion; RT retinal tear; SB scleral buckle; PPV pars plana vitrectomy; VH Vitreous hemorrhage; PRP panretinal laser photocoagulation; IVK intravitreal kenalog; VMT vitreomacular traction; MH Macular hole;  NVD neovascularization of the disc; NVE neovascularization elsewhere; AREDS age related eye disease study; ARMD age related macular degeneration; POAG primary open angle glaucoma; EBMD epithelial/anterior basement membrane dystrophy; ACIOL anterior chamber intraocular lens; IOL intraocular lens; PCIOL posterior chamber intraocular lens; Phaco/IOL phacoemulsification with intraocular lens placement; [place] photorefractive keratectomy; LASIK laser assisted in situ keratomileusis; HTN hypertension; DM diabetes mellitus; COPD chronic obstructive pulmonary disease

## 2021-06-21 DIAGNOSIS — R6889 Other general symptoms and signs: Secondary | ICD-10-CM | POA: Diagnosis not present

## 2021-06-28 ENCOUNTER — Ambulatory Visit (INDEPENDENT_AMBULATORY_CARE_PROVIDER_SITE_OTHER): Payer: BC Managed Care – PPO | Admitting: Ophthalmology

## 2021-06-28 ENCOUNTER — Other Ambulatory Visit: Payer: Self-pay

## 2021-06-28 ENCOUNTER — Encounter (INDEPENDENT_AMBULATORY_CARE_PROVIDER_SITE_OTHER): Payer: Self-pay | Admitting: Ophthalmology

## 2021-06-28 DIAGNOSIS — I10 Essential (primary) hypertension: Secondary | ICD-10-CM | POA: Diagnosis not present

## 2021-06-28 DIAGNOSIS — H353211 Exudative age-related macular degeneration, right eye, with active choroidal neovascularization: Secondary | ICD-10-CM

## 2021-06-28 DIAGNOSIS — H25813 Combined forms of age-related cataract, bilateral: Secondary | ICD-10-CM

## 2021-06-28 DIAGNOSIS — H35051 Retinal neovascularization, unspecified, right eye: Secondary | ICD-10-CM | POA: Diagnosis not present

## 2021-06-28 DIAGNOSIS — H35033 Hypertensive retinopathy, bilateral: Secondary | ICD-10-CM

## 2021-07-13 ENCOUNTER — Ambulatory Visit
Admission: EM | Admit: 2021-07-13 | Discharge: 2021-07-13 | Disposition: A | Discharge status: Home / Self Care | Payer: BC Managed Care – PPO

## 2021-07-13 ENCOUNTER — Other Ambulatory Visit: Payer: Self-pay

## 2021-07-22 DIAGNOSIS — F411 Generalized anxiety disorder: Secondary | ICD-10-CM | POA: Diagnosis not present

## 2021-07-22 DIAGNOSIS — F331 Major depressive disorder, recurrent, moderate: Secondary | ICD-10-CM | POA: Diagnosis not present

## 2021-07-25 DIAGNOSIS — F331 Major depressive disorder, recurrent, moderate: Secondary | ICD-10-CM | POA: Diagnosis not present

## 2021-07-25 DIAGNOSIS — F411 Generalized anxiety disorder: Secondary | ICD-10-CM | POA: Diagnosis not present

## 2021-08-08 DIAGNOSIS — F900 Attention-deficit hyperactivity disorder, predominantly inattentive type: Secondary | ICD-10-CM | POA: Diagnosis not present

## 2021-08-08 DIAGNOSIS — F411 Generalized anxiety disorder: Secondary | ICD-10-CM | POA: Diagnosis not present

## 2021-08-08 DIAGNOSIS — F331 Major depressive disorder, recurrent, moderate: Secondary | ICD-10-CM | POA: Diagnosis not present

## 2021-08-22 DIAGNOSIS — F900 Attention-deficit hyperactivity disorder, predominantly inattentive type: Secondary | ICD-10-CM | POA: Diagnosis not present

## 2021-08-22 DIAGNOSIS — F411 Generalized anxiety disorder: Secondary | ICD-10-CM | POA: Diagnosis not present

## 2021-08-22 DIAGNOSIS — F331 Major depressive disorder, recurrent, moderate: Secondary | ICD-10-CM | POA: Diagnosis not present

## 2021-09-05 DIAGNOSIS — F411 Generalized anxiety disorder: Secondary | ICD-10-CM | POA: Diagnosis not present

## 2021-09-05 DIAGNOSIS — F900 Attention-deficit hyperactivity disorder, predominantly inattentive type: Secondary | ICD-10-CM | POA: Diagnosis not present

## 2021-09-05 DIAGNOSIS — F331 Major depressive disorder, recurrent, moderate: Secondary | ICD-10-CM | POA: Diagnosis not present

## 2021-09-19 DIAGNOSIS — F331 Major depressive disorder, recurrent, moderate: Secondary | ICD-10-CM | POA: Diagnosis not present

## 2021-09-19 DIAGNOSIS — F411 Generalized anxiety disorder: Secondary | ICD-10-CM | POA: Diagnosis not present

## 2021-09-19 DIAGNOSIS — F900 Attention-deficit hyperactivity disorder, predominantly inattentive type: Secondary | ICD-10-CM | POA: Diagnosis not present

## 2021-09-26 DIAGNOSIS — F331 Major depressive disorder, recurrent, moderate: Secondary | ICD-10-CM | POA: Diagnosis not present

## 2021-09-26 DIAGNOSIS — F411 Generalized anxiety disorder: Secondary | ICD-10-CM | POA: Diagnosis not present

## 2021-09-26 DIAGNOSIS — F900 Attention-deficit hyperactivity disorder, predominantly inattentive type: Secondary | ICD-10-CM | POA: Diagnosis not present

## 2021-12-19 DIAGNOSIS — F900 Attention-deficit hyperactivity disorder, predominantly inattentive type: Secondary | ICD-10-CM | POA: Diagnosis not present

## 2021-12-19 DIAGNOSIS — F411 Generalized anxiety disorder: Secondary | ICD-10-CM | POA: Diagnosis not present

## 2021-12-19 DIAGNOSIS — F331 Major depressive disorder, recurrent, moderate: Secondary | ICD-10-CM | POA: Diagnosis not present

## 2021-12-27 ENCOUNTER — Encounter (INDEPENDENT_AMBULATORY_CARE_PROVIDER_SITE_OTHER): Payer: BC Managed Care – PPO | Admitting: Ophthalmology

## 2021-12-31 NOTE — Progress Notes (Signed)
[facility] Note  01/03/2022     CHIEF COMPLAINT Patient presents for Retina Follow Up  HISTORY OF PRESENT ILLNESS: Tristan Thomas is a 51 y.o. male who presents to the clinic today for:   HPI     Retina Follow Up   Patient presents with  Other.  In right eye.  Severity is moderate.  Duration of 6 months.  Since onset it is stable.  I, the attending physician,  performed the HPI with the patient and updated documentation appropriately.        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 Pt states VA the same, no changes or issues.       Last edited by Bernarda Caffey, MD on 01/03/2022  1:36 PM.    pt states vision is stable, no new health concerns  Referring physician: Janie Morning, DO [place],  [place] [address]  HISTORICAL INFORMATION:   Selected notes from the MEDICAL RECORD NUMBER Referred by Dr. Martinique DeMarco for concern of worsening PED   CURRENT MEDICATIONS: No current outpatient medications on file. (Ophthalmic Drugs)   No current facility-administered medications for this visit. (Ophthalmic Drugs)   Current Outpatient Medications (Other)  Medication Sig   buPROPion (WELLBUTRIN) 75 MG tablet Take 75 mg by mouth 2 (two) times daily.   busPIRone (BUSPAR) 10 MG tablet 1 tablet   busPIRone (BUSPAR) 5 MG tablet Take 5 mg by mouth 3 (three) times daily.   fluticasone (FLONASE) 50 MCG/ACT nasal spray Place 1 spray into both nostrils daily as needed for allergies or rhinitis.   fluticasone (FLONASE) 50 MCG/ACT nasal spray 2 sprays in each nostril   loratadine (CLARITIN) 10 MG tablet Take 10 mg by mouth daily.   Red Yeast Rice 600 MG TABS 1 tablet   tamsulosin (FLOMAX) 0.4 MG CAPS capsule tamsulosin 0.4 mg capsule   traMADol (ULTRAM) 50 MG tablet 1-2 tablets as needed   venlafaxine XR (EFFEXOR-XR) 150 MG 24 hr capsule Take 150 mg by mouth daily.   benzonatate (TESSALON) 200 MG capsule Take 200  mg by mouth 3 (three) times daily as needed. (Patient not taking: Reported on 01/03/2022)   Cetirizine HCl 10 MG CAPS Take 1 capsule (10 mg total) by mouth daily for 10 days.   cyclobenzaprine (FLEXERIL) 5 MG tablet 1-2 tablets at bedtime as needed (Patient not taking: Reported on 01/03/2022)   esomeprazole (NEXIUM) 40 MG capsule 1 capsule (Patient not taking: Reported on 01/03/2022)   ibuprofen (ADVIL,MOTRIN) 600 MG tablet Take 1 tablet (600 mg total) by mouth every 6 (six) hours as needed.   loratadine (CLARITIN) 10 MG tablet 1 tablet (Patient not taking: Reported on 06/28/2021)   omeprazole (PRILOSEC) 20 MG capsule Take 20 mg by mouth 3 times/day as needed-between meals & bedtime.  (Patient not taking: Reported on 01/03/2022)   oxyCODONE-acetaminophen (PERCOCET) 5-325 MG tablet Take 1 tablet by mouth every 4 (four) hours as needed. (Patient not taking: Reported on 01/03/2022)   oxyCODONE-acetaminophen (ROXICET) 5-325 MG tablet Take 1 tablet by mouth every 4 (four) hours as needed for severe pain. (Patient not taking: Reported on 01/03/2022)   PARoxetine (PAXIL) 10 MG tablet Take 10 mg by mouth daily. (Patient not taking: Reported on 01/03/2022)   predniSONE (DELTASONE) 20 MG tablet Take 20 mg by mouth daily. (Patient not taking: Reported on 01/03/2022)   promethazine-phenylephrine (PROMETHAZINE VC) 6.25-5 MG/5ML SYRP 5 ml as  needed (Patient not taking: Reported on 01/03/2022)   SUMAtriptan (IMITREX) 100 MG tablet Take 100 mg by mouth every 2 (two) hours as needed for migraine. May repeat in 2 hours if headache persists or recurs. (Patient not taking: Reported on 01/03/2022)   SUMAtriptan (IMITREX) 100 MG tablet 1 tablet as needed (Patient not taking: Reported on 01/03/2022)   No current facility-administered medications for this visit. (Other)   REVIEW OF SYSTEMS: ROS   Positive for: Gastrointestinal, Neurological, Musculoskeletal, Eyes, Respiratory Negative for: Constitutional, Skin, Genitourinary, HENT,  Endocrine, Cardiovascular, Psychiatric, Allergic/Imm, Heme/Lymph Last edited by Kingsley Spittle, COT on 01/03/2022  9:07 AM.     ALLERGIES Allergies  Allergen Reactions   Hydrocodone-Acetaminophen Other (See Comments)    Pt got cold-chills     PAST MEDICAL HISTORY Past Medical History:  Diagnosis Date   ADHD (attention deficit hyperactivity disorder)    Anxiety    Arthritis    Asthma    not diagnosed   Cataract    Mixed OU   GERD (gastroesophageal reflux disease)    Hypertensive retinopathy    OU   Past Surgical History:  Procedure Laterality Date   ORIF ANKLE FRACTURE Right 04/15/2017   Procedure: OPEN REDUCTION INTERNAL FIXATION (ORIF) RIGHT ANKLE AND SYNDESMOSIS;  Surgeon: Newt Minion, MD;  Location: [facility];  Service: Orthopedics;  Laterality: Right;   TOOTH EXTRACTION     9 removed   FAMILY HISTORY Family History  Problem Relation Age of Onset   Heart disease Father    Stroke Mother    Hypertension Mother    Hyperlipidemia Mother    SOCIAL HISTORY Social History   Tobacco Use   Smoking status: Former    Years: 15.00    Types: Cigarettes   Smokeless tobacco: Never   Tobacco comments:    quit 1998 ish  Vaping Use   Vaping Use: Never used  Substance Use Topics   Alcohol use: Yes    Comment: monthy maybe   Drug use: No       OPHTHALMIC EXAM: Base Eye Exam     Visual Acuity (Snellen - Linear)       Right Left   Dist cc 20/20 20/25 +2    Correction: Glasses         Tonometry (Tonopen, 9:14 AM)       Right Left   Pressure 13 14         Pupils       Dark Light Shape React APD   Right 3 2 Round Minimal None   Left 3 2 Round Minimal None         Visual Fields (Counting fingers)       Left Right    Full Full         Extraocular Movement       Right Left    Full, Ortho Full, Ortho         Neuro/Psych     Oriented x3: Yes   Mood/Affect: Normal         Dilation     Both eyes: 1.0% Mydriacyl, 2.5%  Phenylephrine @ 9:15 AM           Slit Lamp and Fundus Exam     Slit Lamp Exam       Right Left   Lids/Lashes mild MGD  mild MGD   Conjunctiva/Sclera White and quiet White and quiet   Cornea trace tear film debris trace tear film debris  Anterior Chamber deep and clear, narrow temporal angle deep and clear, narrow temporal angle   Iris round and dilated round and dilated   Lens 1 + Nucleus sclerois, 1+ cortical 1 + Nucleus sclerois, 1+ cortical   Anterior Vitreous mild syneresis mild syneresis         Fundus Exam       Right Left   Disc compact, pink and sharp, mild PPA compact, pink and sharp   C/D Ratio 0.1 0.1   Macula flat, good foveal reflex, focal CNVM/PED with focal pigment clumping centrally and stable resolution of focal surrounding SRF and IRF-- nasal to fovea, focal druse IT mac, No heme or edema flat, good foveal reflex, mild RPE mottling, no heme or edema   Vessels mild attenuation, mild tortuosity, mild copper wiring mild attenuation, mild tortuosity, mild copper wiring, mild AV crossing changes   Periphery attached, no heme attached, no heme           Refraction     Wearing Rx       Sphere Cylinder Axis   Right +2.50 +0.25 140   Left +1.50 +0.25 065           IMAGING AND PROCEDURES  Imaging and Procedures for _0 @  OCT, Retina - OU - Both Eyes       Right Eye Quality was good. Central Foveal Thickness: 270. Progression has improved. Findings include normal foveal contour, no IRF, no SRF, intraretinal hyper-reflective material, choroidal neovascular membrane, pigment epithelial detachment, outer retinal atrophy, vitreomacular adhesion (Stable improvement in IRF overlying CNVM nasal to fovea, persistent IRHM -- improved).   Left Eye Quality was good. Central Foveal Thickness: 282. Progression has been stable. Findings include normal foveal contour, no IRF, no SRF, vitreomacular adhesion .   Notes *Images captured and stored on  drive  Diagnosis / Impression:  OD: focal idiopathic CNV -- Stable improvement in IRF/SRF overlying CNVM nasal to fovea, persistent IRHM -- improved OS: NFP, no IRF/SRF, VMA  Clinical management:  See below  Abbreviations: NFP - Normal foveal profile. CME - cystoid macular edema. PED - pigment epithelial detachment. IRF - intraretinal fluid. SRF - subretinal fluid. EZ - ellipsoid zone. ERM - epiretinal membrane. ORA - outer retinal atrophy. ORT - outer retinal tubulation. SRHM - subretinal hyper-reflective material            ASSESSMENT/PLAN:    ICD-10-CM   1. 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 Exudative age-related macular degeneration of right eye with active choroidal neovascularization ([place])  H35.3211     3. Essential hypertension  I10     4. Hypertensive retinopathy of both eyes  H35.033     5. Combined forms of age-related cataract of both eyes  H25.813       1,2. 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 Hypertensive retinopathy OU  - discussed importance of tight BP control  - monitor    5.  Mixed cataracts OU  - The symptoms of cataract, surgical options, and treatments and risks were discussed with  patient.  - discussed diagnosis and progression  - not yet visually significant  - monitor   Ophthalmic Meds Ordered this visit:  No orders of the defined types were placed in this encounter.    Return in about 1 year (around 01/04/2023) for f/u idiopathic CNV OD, DFE, OCT.  There are no Patient Instructions on file for this visit.  This document serves as a record of services personally performed by Gardiner Sleeper, MD, PhD. It was created on their behalf by Leonie Douglas, an ophthalmic technician. The creation of this record is the provider's dictation and/or activities during the visit.    Electronically signed by: Leonie Douglas COA, 01/03/22  1:38 PM  This document serves as a record of services personally performed by Gardiner Sleeper, MD, PhD. It was created on their behalf by San Jetty. Owens Shark, OA an ophthalmic technician. The creation of this record is the provider's dictation and/or activities during the visit.    Electronically signed by: San Jetty. Owens Shark, [place] 07.14.2023 1:38 PM   Gardiner Sleeper, M.D., Ph.D. Diseases & Surgery of the Retina and Vitreous [facility]  I have reviewed the above documentation for accuracy and completeness, and I agree with the above. Gardiner Sleeper, M.D., Ph.D. 01/03/22 1:39 PM  Abbreviations: M myopia (nearsighted); A astigmatism; H hyperopia (farsighted); P presbyopia; Mrx spectacle prescription;  CTL contact lenses; OD right eye; OS left eye; OU both eyes  XT exotropia; ET esotropia; PEK punctate epithelial keratitis; PEE punctate epithelial erosions; DES dry eye syndrome; MGD meibomian gland dysfunction; ATs artificial tears; PFAT's preservative free artificial tears; [place] nuclear sclerotic cataract; PSC posterior subcapsular cataract; ERM epi-retinal membrane; PVD posterior vitreous detachment; RD retinal detachment; DM diabetes mellitus; DR diabetic retinopathy; NPDR non-proliferative diabetic retinopathy; PDR proliferative  diabetic retinopathy; CSME clinically significant macular edema; DME diabetic macular edema; dbh dot blot hemorrhages; CWS cotton wool spot; POAG primary open angle glaucoma; C/D cup-to-disc ratio; HVF humphrey visual field; GVF goldmann visual field; OCT optical coherence tomography; IOP intraocular pressure; BRVO Branch retinal vein occlusion; CRVO central retinal vein occlusion; CRAO central retinal artery occlusion; BRAO branch retinal artery occlusion; RT retinal tear; SB scleral buckle; PPV pars plana vitrectomy; VH Vitreous hemorrhage; PRP panretinal laser photocoagulation; IVK intravitreal kenalog; VMT vitreomacular traction; MH Macular hole;  NVD neovascularization of the disc; NVE neovascularization elsewhere; AREDS age related eye disease study; ARMD age related macular degeneration; POAG primary open angle glaucoma; EBMD epithelial/anterior basement membrane dystrophy; ACIOL anterior chamber intraocular lens; IOL intraocular lens; PCIOL posterior chamber intraocular lens; Phaco/IOL phacoemulsification with intraocular lens placement; [place] photorefractive keratectomy; LASIK laser assisted in situ keratomileusis; HTN hypertension; DM diabetes mellitus; COPD chronic obstructive pulmonary disease

## 2022-01-03 ENCOUNTER — Encounter (INDEPENDENT_AMBULATORY_CARE_PROVIDER_SITE_OTHER): Payer: Self-pay | Admitting: Ophthalmology

## 2022-01-03 ENCOUNTER — Ambulatory Visit (INDEPENDENT_AMBULATORY_CARE_PROVIDER_SITE_OTHER): Payer: BC Managed Care – PPO | Admitting: Ophthalmology

## 2022-01-03 DIAGNOSIS — H35033 Hypertensive retinopathy, bilateral: Secondary | ICD-10-CM | POA: Diagnosis not present

## 2022-01-03 DIAGNOSIS — H25813 Combined forms of age-related cataract, bilateral: Secondary | ICD-10-CM

## 2022-01-03 DIAGNOSIS — H353211 Exudative age-related macular degeneration, right eye, with active choroidal neovascularization: Secondary | ICD-10-CM

## 2022-01-03 DIAGNOSIS — I10 Essential (primary) hypertension: Secondary | ICD-10-CM

## 2022-01-03 DIAGNOSIS — H35051 Retinal neovascularization, unspecified, right eye: Secondary | ICD-10-CM | POA: Diagnosis not present

## 2022-01-16 DIAGNOSIS — F331 Major depressive disorder, recurrent, moderate: Secondary | ICD-10-CM | POA: Diagnosis not present

## 2022-01-16 DIAGNOSIS — F900 Attention-deficit hyperactivity disorder, predominantly inattentive type: Secondary | ICD-10-CM | POA: Diagnosis not present

## 2022-01-16 DIAGNOSIS — F411 Generalized anxiety disorder: Secondary | ICD-10-CM | POA: Diagnosis not present

## 2022-02-14 DIAGNOSIS — F4323 Adjustment disorder with mixed anxiety and depressed mood: Secondary | ICD-10-CM | POA: Diagnosis not present

## 2022-03-07 DIAGNOSIS — F4323 Adjustment disorder with mixed anxiety and depressed mood: Secondary | ICD-10-CM | POA: Diagnosis not present

## 2022-03-20 DIAGNOSIS — H35721 Serous detachment of retinal pigment epithelium, right eye: Secondary | ICD-10-CM | POA: Diagnosis not present

## 2022-03-20 DIAGNOSIS — H524 Presbyopia: Secondary | ICD-10-CM | POA: Diagnosis not present

## 2022-03-20 DIAGNOSIS — H35051 Retinal neovascularization, unspecified, right eye: Secondary | ICD-10-CM | POA: Diagnosis not present

## 2022-03-20 DIAGNOSIS — H35361 Drusen (degenerative) of macula, right eye: Secondary | ICD-10-CM | POA: Diagnosis not present

## 2022-03-20 DIAGNOSIS — H35033 Hypertensive retinopathy, bilateral: Secondary | ICD-10-CM | POA: Diagnosis not present

## 2022-03-20 DIAGNOSIS — H2513 Age-related nuclear cataract, bilateral: Secondary | ICD-10-CM | POA: Diagnosis not present

## 2022-04-04 DIAGNOSIS — F4323 Adjustment disorder with mixed anxiety and depressed mood: Secondary | ICD-10-CM | POA: Diagnosis not present

## 2022-05-02 DIAGNOSIS — F4323 Adjustment disorder with mixed anxiety and depressed mood: Secondary | ICD-10-CM | POA: Diagnosis not present

## 2022-05-23 DIAGNOSIS — F4323 Adjustment disorder with mixed anxiety and depressed mood: Secondary | ICD-10-CM | POA: Diagnosis not present

## 2022-06-06 DIAGNOSIS — R946 Abnormal results of thyroid function studies: Secondary | ICD-10-CM | POA: Diagnosis not present

## 2022-06-06 DIAGNOSIS — E785 Hyperlipidemia, unspecified: Secondary | ICD-10-CM | POA: Diagnosis not present

## 2022-06-06 DIAGNOSIS — Z79899 Other long term (current) drug therapy: Secondary | ICD-10-CM | POA: Diagnosis not present

## 2022-06-06 DIAGNOSIS — R7309 Other abnormal glucose: Secondary | ICD-10-CM | POA: Diagnosis not present

## 2022-06-06 DIAGNOSIS — Z125 Encounter for screening for malignant neoplasm of prostate: Secondary | ICD-10-CM | POA: Diagnosis not present

## 2022-06-08 ENCOUNTER — Ambulatory Visit (HOSPITAL_COMMUNITY): Payer: BLUE CROSS/BLUE SHIELD

## 2022-06-13 DIAGNOSIS — R252 Cramp and spasm: Secondary | ICD-10-CM | POA: Diagnosis not present

## 2022-06-13 DIAGNOSIS — Z0000 Encounter for general adult medical examination without abnormal findings: Secondary | ICD-10-CM | POA: Diagnosis not present

## 2022-06-13 DIAGNOSIS — J988 Other specified respiratory disorders: Secondary | ICD-10-CM | POA: Diagnosis not present

## 2022-06-27 DIAGNOSIS — F4323 Adjustment disorder with mixed anxiety and depressed mood: Secondary | ICD-10-CM | POA: Diagnosis not present

## 2022-07-25 DIAGNOSIS — F4323 Adjustment disorder with mixed anxiety and depressed mood: Secondary | ICD-10-CM | POA: Diagnosis not present

## 2022-08-15 DIAGNOSIS — F4323 Adjustment disorder with mixed anxiety and depressed mood: Secondary | ICD-10-CM | POA: Diagnosis not present

## 2022-08-29 DIAGNOSIS — F4323 Adjustment disorder with mixed anxiety and depressed mood: Secondary | ICD-10-CM | POA: Diagnosis not present

## 2022-09-01 DIAGNOSIS — F900 Attention-deficit hyperactivity disorder, predominantly inattentive type: Secondary | ICD-10-CM | POA: Diagnosis not present

## 2022-09-01 DIAGNOSIS — F331 Major depressive disorder, recurrent, moderate: Secondary | ICD-10-CM | POA: Diagnosis not present

## 2022-09-01 DIAGNOSIS — F411 Generalized anxiety disorder: Secondary | ICD-10-CM | POA: Diagnosis not present

## 2022-09-19 DIAGNOSIS — F4323 Adjustment disorder with mixed anxiety and depressed mood: Secondary | ICD-10-CM | POA: Diagnosis not present

## 2022-10-03 DIAGNOSIS — F4323 Adjustment disorder with mixed anxiety and depressed mood: Secondary | ICD-10-CM | POA: Diagnosis not present

## 2022-10-10 DIAGNOSIS — Z1211 Encounter for screening for malignant neoplasm of colon: Secondary | ICD-10-CM | POA: Diagnosis not present

## 2022-10-10 DIAGNOSIS — Z1212 Encounter for screening for malignant neoplasm of rectum: Secondary | ICD-10-CM | POA: Diagnosis not present

## 2022-10-24 DIAGNOSIS — F4323 Adjustment disorder with mixed anxiety and depressed mood: Secondary | ICD-10-CM | POA: Diagnosis not present

## 2022-11-14 DIAGNOSIS — F4323 Adjustment disorder with mixed anxiety and depressed mood: Secondary | ICD-10-CM | POA: Diagnosis not present

## 2022-11-24 DIAGNOSIS — K219 Gastro-esophageal reflux disease without esophagitis: Secondary | ICD-10-CM | POA: Diagnosis not present

## 2022-11-24 DIAGNOSIS — R195 Other fecal abnormalities: Secondary | ICD-10-CM | POA: Diagnosis not present

## 2022-12-05 DIAGNOSIS — F4323 Adjustment disorder with mixed anxiety and depressed mood: Secondary | ICD-10-CM | POA: Diagnosis not present

## 2022-12-11 DIAGNOSIS — K562 Volvulus: Secondary | ICD-10-CM | POA: Diagnosis not present

## 2022-12-11 DIAGNOSIS — Z1211 Encounter for screening for malignant neoplasm of colon: Secondary | ICD-10-CM | POA: Diagnosis not present

## 2022-12-11 DIAGNOSIS — K5730 Diverticulosis of large intestine without perforation or abscess without bleeding: Secondary | ICD-10-CM | POA: Diagnosis not present

## 2022-12-31 NOTE — Progress Notes (Signed)
[facility] - Clinic Note  01/09/2023     CHIEF COMPLAINT Patient presents for Retina Follow Up  HISTORY OF PRESENT ILLNESS: Tristan Thomas is a 52 y.o. male who presents to the clinic today for:   HPI     Retina Follow Up   Patient presents with  Other (CNV).  In right eye.  Severity is moderate.  Duration of 1 year.  I, the attending physician,  performed the HPI with the patient and updated documentation appropriately.        Comments   Patient states vision good OU. Got new glasses Rx in past year.       Last edited by Rennis Chris, MD on 01/10/2023 11:58 PM.    pt states vision is stable  Referring physician: Irena Reichmann, DO [address] [place],  [place] [address]  HISTORICAL INFORMATION:   Selected notes from the MEDICAL RECORD NUMBER Referred by Dr. [place] DeMarco for concern of worsening PED   CURRENT MEDICATIONS: No current outpatient medications on file. (Ophthalmic Drugs)   No current facility-administered medications for this visit. (Ophthalmic Drugs)   Current Outpatient Medications (Other)  Medication Sig   buPROPion (WELLBUTRIN) 75 MG tablet Take 75 mg by mouth 2 (two) times daily.   busPIRone (BUSPAR) 10 MG tablet 1 tablet   busPIRone (BUSPAR) 5 MG tablet Take 5 mg by mouth 3 (three) times daily.   fluticasone (FLONASE) 50 MCG/ACT nasal spray Place 1 spray into both nostrils daily as needed for allergies or rhinitis.   fluticasone (FLONASE) 50 MCG/ACT nasal spray 2 sprays in each nostril   ibuprofen (ADVIL,MOTRIN) 600 MG tablet Take 1 tablet (600 mg total) by mouth every 6 (six) hours as needed.   Red Yeast Rice 600 MG TABS 1 tablet   tamsulosin (FLOMAX) 0.4 MG CAPS capsule tamsulosin 0.4 mg capsule   traMADol (ULTRAM) 50 MG tablet 1-2 tablets as needed   venlafaxine XR (EFFEXOR-XR) 150 MG 24 hr capsule Take 150 mg by mouth daily.   benzonatate (TESSALON) 200 MG capsule Take 200 mg by mouth 3 (three) times  daily as needed. (Patient not taking: Reported on 01/03/2022)   Cetirizine HCl 10 MG CAPS Take 1 capsule (10 mg total) by mouth daily for 10 days.   cyclobenzaprine (FLEXERIL) 5 MG tablet 1-2 tablets at bedtime as needed (Patient not taking: Reported on 01/03/2022)   esomeprazole (NEXIUM) 40 MG capsule 1 capsule (Patient not taking: Reported on 01/03/2022)   loratadine (CLARITIN) 10 MG tablet Take 10 mg by mouth daily. (Patient not taking: Reported on 01/09/2023)   loratadine (CLARITIN) 10 MG tablet 1 tablet (Patient not taking: Reported on 06/28/2021)   omeprazole (PRILOSEC) 20 MG capsule Take 20 mg by mouth 3 times/day as needed-between meals & bedtime.  (Patient not taking: Reported on 01/03/2022)   oxyCODONE-acetaminophen (PERCOCET) 5-325 MG tablet Take 1 tablet by mouth every 4 (four) hours as needed. (Patient not taking: Reported on 01/03/2022)   oxyCODONE-acetaminophen (ROXICET) 5-325 MG tablet Take 1 tablet by mouth every 4 (four) hours as needed for severe pain. (Patient not taking: Reported on 01/03/2022)   PARoxetine (PAXIL) 10 MG tablet Take 10 mg by mouth daily. (Patient not taking: Reported on 01/03/2022)   predniSONE (DELTASONE) 20 MG tablet Take 20 mg by mouth daily. (Patient not taking: Reported on 01/03/2022)   promethazine-phenylephrine (PROMETHAZINE VC) 6.25-5 MG/5ML SYRP 5 ml as needed (Patient not taking: Reported on 01/03/2022)   SUMAtriptan (IMITREX) 100  MG tablet Take 100 mg by mouth every 2 (two) hours as needed for migraine. May repeat in 2 hours if headache persists or recurs. (Patient not taking: Reported on 01/03/2022)   SUMAtriptan (IMITREX) 100 MG tablet 1 tablet as needed (Patient not taking: Reported on 01/03/2022)   No current facility-administered medications for this visit. (Other)   REVIEW OF SYSTEMS: ROS   Positive for: Gastrointestinal, Neurological, Musculoskeletal, Eyes, Respiratory Negative for: Constitutional, Skin, Genitourinary, HENT, Endocrine, Cardiovascular,  Psychiatric, Allergic/Imm, Heme/Lymph Last edited by Annalee Genta D, COT on 01/09/2023  8:31 AM.      ALLERGIES Allergies  Allergen Reactions   Hydrocodone-Acetaminophen Other (See Comments)    Pt got cold-chills     PAST MEDICAL HISTORY Past Medical History:  Diagnosis Date   ADHD (attention deficit hyperactivity disorder)    Anxiety    Arthritis    Asthma    not diagnosed   Cataract    Mixed OU   GERD (gastroesophageal reflux disease)    Hypertensive retinopathy    OU   Past Surgical History:  Procedure Laterality Date   ORIF ANKLE FRACTURE Right 04/15/2017   Procedure: OPEN REDUCTION INTERNAL FIXATION (ORIF) RIGHT ANKLE AND SYNDESMOSIS;  Surgeon: Nadara Mustard, MD;  Location: [facility] OR;  Service: Orthopedics;  Laterality: Right;   TOOTH EXTRACTION     9 removed   FAMILY HISTORY Family History  Problem Relation Age of Onset   Heart disease Father    Stroke Mother    Hypertension Mother    Hyperlipidemia Mother    SOCIAL HISTORY Social History   Tobacco Use   Smoking status: Former    Types: Cigarettes   Smokeless tobacco: Never   Tobacco comments:    quit 1998 ish  Vaping Use   Vaping status: Never Used  Substance Use Topics   Alcohol use: Yes    Comment: monthy maybe   Drug use: No       OPHTHALMIC EXAM: Base Eye Exam     Visual Acuity (Snellen - Linear)       Right Left   Dist cc 20/20 -1 20/20 -1    Correction: Glasses         Tonometry (Tonopen, 8:42 AM)       Right Left   Pressure 12 12         Pupils       Dark Light Shape React APD   Right 3 2 Round Minimal None   Left 3 2 Round Minimal None         Visual Fields (Counting fingers)       Left Right    Full Full         Neuro/Psych     Oriented x3: Yes   Mood/Affect: Normal         Dilation     Both eyes: 1.0% Mydriacyl, 2.5% Phenylephrine @ 8:42 AM           Slit Lamp and Fundus Exam     Slit Lamp Exam       Right Left   Lids/Lashes  Dermatochalasis - upper lid, Meibomian gland dysfunction Dermatochalasis - upper lid, Meibomian gland dysfunction   Conjunctiva/Sclera White and quiet White and quiet   Cornea Clear Clear   Anterior Chamber moderate depth, narrow angles moderate depth, narrow angles   Iris round and dilated, mild anterior bowing round and dilated, mild anterior bowing   Lens 1-2 + Nucleus sclerois, 2+ cortical 1-2 + Nucleus  sclerois, 2+ cortical   Anterior Vitreous mild syneresis mild syneresis         Fundus Exam       Right Left   Disc compact, pink and sharp, mild PPA compact, pink and sharp   C/D Ratio 0.1 0.2   Macula flat, good foveal reflex, focal CNVM/PED with focal pigment clumping centrally and stable resolution of focal surrounding SRF and IRF -- nasal to fovea, focal druse IT mac, No heme or edema flat, good foveal reflex, mild RPE mottling, no heme or edema   Vessels mild attenuation, mild tortuosity, mild copper wiring mild attenuation, mild tortuosity, mild copper wiring, mild AV crossing changes   Periphery attached, no heme attached, no heme           Refraction     Wearing Rx       Sphere Cylinder Add   Right +2.75 Sphere +2.00   Left +2.25 Sphere +2.00           IMAGING AND PROCEDURES  Imaging and Procedures for @TODAY @  OCT, Retina - OU - Both Eyes       Right Eye Quality was good. Central Foveal Thickness: 274. Progression has been stable. Findings include normal foveal contour, no IRF, no SRF, intraretinal hyper-reflective material, choroidal neovascular membrane, pigment epithelial detachment, outer retinal atrophy, vitreomacular adhesion (Stable improvement in IRF overlying punctate, focal CNVM nasal to fovea, persistent IRHM -- improved).   Left Eye Quality was good. Central Foveal Thickness: 284. Progression has been stable. Findings include normal foveal contour, no IRF, no SRF, vitreomacular adhesion .   Notes *Images captured and stored on  drive  Diagnosis / Impression:  OD: focal idiopathic CNV -- Stable improvement in IRF/SRF overlying punctate, focal CNVM nasal to fovea OS: NFP, no IRF/SRF, VMA  Clinical management:  See below  Abbreviations: NFP - Normal foveal profile. CME - cystoid macular edema. PED - pigment epithelial detachment. IRF - intraretinal fluid. SRF - subretinal fluid. EZ - ellipsoid zone. ERM - epiretinal membrane. ORA - outer retinal atrophy. ORT - outer retinal tubulation. SRHM - subretinal hyper-reflective material            ASSESSMENT/PLAN:    ICD-10-CM   1. 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 Exudative age-related macular degeneration of right eye with active choroidal neovascularization (HCC)  H35.3211     3. Essential hypertension  I10     4. Hypertensive retinopathy of both eyes  H35.033     5. Combined forms of age-related cataract of both eyes  H25.813       1,2. 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 Hypertensive retinopathy OU  - discussed importance of tight BP control  - monitor    5.  Mixed cataracts OU  - The symptoms of cataract, surgical options, and treatments and risks were  discussed with patient.  - discussed diagnosis and progression  - not yet visually significant  - monitor   Ophthalmic Meds Ordered this visit:  No orders of the defined types were placed in this encounter.    Return in about 1 year (around 01/09/2024) for f/u idiopathic CNV OD, DFE, OCT.  There are no Patient Instructions on file for this visit.  This document serves as a record of services personally performed by Karie Chimera, MD, PhD. It was created on their behalf by Glee Arvin. Manson Passey, OA an ophthalmic technician. The creation of this record is the provider's dictation and/or activities during the visit.    Electronically signed by: Glee Arvin. Manson Passey, OA 01/11/23 12:00 AM  Karie Chimera, M.D., Ph.D. Diseases & Surgery of the Retina and Vitreous [facility]  I have reviewed the above documentation for accuracy and completeness, and I agree with the above. Karie Chimera, M.D., Ph.D. 01/11/23 12:01 AM   Abbreviations: M myopia (nearsighted); A astigmatism; H hyperopia (farsighted); P presbyopia; Mrx spectacle prescription;  CTL contact lenses; OD right eye; OS left eye; OU both eyes  XT exotropia; ET esotropia; PEK punctate epithelial keratitis; PEE punctate epithelial erosions; DES dry eye syndrome; MGD meibomian gland dysfunction; ATs artificial tears; PFAT's preservative free artificial tears; NSC nuclear sclerotic cataract; PSC posterior subcapsular cataract; ERM epi-retinal membrane; PVD posterior vitreous detachment; RD retinal detachment; DM diabetes mellitus; DR diabetic retinopathy; NPDR non-proliferative diabetic retinopathy; PDR proliferative diabetic retinopathy; CSME clinically significant macular edema; DME diabetic macular edema; dbh dot blot hemorrhages; CWS cotton wool spot; POAG primary open angle glaucoma; C/D cup-to-disc ratio; HVF humphrey visual field; GVF goldmann visual field; OCT optical coherence tomography; IOP intraocular pressure; BRVO  Branch retinal vein occlusion; CRVO central retinal vein occlusion; CRAO central retinal artery occlusion; BRAO branch retinal artery occlusion; RT retinal tear; SB scleral buckle; PPV pars plana vitrectomy; VH Vitreous hemorrhage; PRP panretinal laser photocoagulation; IVK intravitreal kenalog; VMT vitreomacular traction; MH Macular hole;  NVD neovascularization of the disc; NVE neovascularization elsewhere; AREDS age related eye disease study; ARMD age related macular degeneration; POAG primary open angle glaucoma; EBMD epithelial/anterior basement membrane dystrophy; ACIOL anterior chamber intraocular lens; IOL intraocular lens; PCIOL posterior chamber intraocular lens; Phaco/IOL phacoemulsification with intraocular lens placement; PRK photorefractive keratectomy; LASIK laser assisted in situ keratomileusis; HTN hypertension; DM diabetes mellitus; COPD chronic obstructive pulmonary disease

## 2023-01-02 ENCOUNTER — Encounter (INDEPENDENT_AMBULATORY_CARE_PROVIDER_SITE_OTHER): Payer: BC Managed Care – PPO | Admitting: Ophthalmology

## 2023-01-09 ENCOUNTER — Encounter (INDEPENDENT_AMBULATORY_CARE_PROVIDER_SITE_OTHER): Payer: Self-pay | Admitting: Ophthalmology

## 2023-01-09 ENCOUNTER — Ambulatory Visit (INDEPENDENT_AMBULATORY_CARE_PROVIDER_SITE_OTHER): Payer: BC Managed Care – PPO | Admitting: Ophthalmology

## 2023-01-09 DIAGNOSIS — H35033 Hypertensive retinopathy, bilateral: Secondary | ICD-10-CM

## 2023-01-09 DIAGNOSIS — H353211 Exudative age-related macular degeneration, right eye, with active choroidal neovascularization: Secondary | ICD-10-CM

## 2023-01-09 DIAGNOSIS — H35051 Retinal neovascularization, unspecified, right eye: Secondary | ICD-10-CM | POA: Diagnosis not present

## 2023-01-09 DIAGNOSIS — H25813 Combined forms of age-related cataract, bilateral: Secondary | ICD-10-CM

## 2023-01-09 DIAGNOSIS — I10 Essential (primary) hypertension: Secondary | ICD-10-CM | POA: Diagnosis not present

## 2023-01-10 ENCOUNTER — Encounter (INDEPENDENT_AMBULATORY_CARE_PROVIDER_SITE_OTHER): Payer: Self-pay | Admitting: Ophthalmology

## 2023-02-13 DIAGNOSIS — H6980 Other specified disorders of Eustachian tube, unspecified ear: Secondary | ICD-10-CM | POA: Diagnosis not present

## 2023-02-13 DIAGNOSIS — J392 Other diseases of pharynx: Secondary | ICD-10-CM | POA: Diagnosis not present

## 2023-03-18 DIAGNOSIS — M542 Cervicalgia: Secondary | ICD-10-CM | POA: Diagnosis not present

## 2023-03-18 DIAGNOSIS — R070 Pain in throat: Secondary | ICD-10-CM | POA: Diagnosis not present

## 2023-04-10 DIAGNOSIS — H35363 Drusen (degenerative) of macula, bilateral: Secondary | ICD-10-CM | POA: Diagnosis not present

## 2023-04-10 DIAGNOSIS — H524 Presbyopia: Secondary | ICD-10-CM | POA: Diagnosis not present

## 2023-04-10 DIAGNOSIS — H18513 Endothelial corneal dystrophy, bilateral: Secondary | ICD-10-CM | POA: Diagnosis not present

## 2023-04-10 DIAGNOSIS — H35721 Serous detachment of retinal pigment epithelium, right eye: Secondary | ICD-10-CM | POA: Diagnosis not present

## 2023-04-10 DIAGNOSIS — H25813 Combined forms of age-related cataract, bilateral: Secondary | ICD-10-CM | POA: Diagnosis not present

## 2023-04-10 DIAGNOSIS — H35051 Retinal neovascularization, unspecified, right eye: Secondary | ICD-10-CM | POA: Diagnosis not present

## 2023-05-09 ENCOUNTER — Ambulatory Visit
Admission: EM | Admit: 2023-05-09 | Discharge: 2023-05-09 | Disposition: A | Discharge status: Home / Self Care | Payer: BC Managed Care – PPO | Attending: Internal Medicine | Admitting: Internal Medicine

## 2023-05-09 DIAGNOSIS — J069 Acute upper respiratory infection, unspecified: Secondary | ICD-10-CM | POA: Diagnosis not present

## 2023-05-09 MED ORDER — BENZONATATE 100 MG PO CAPS: 100.0000 mg | ORAL_CAPSULE | Freq: Three times a day (TID) | ORAL | 0 refills | Status: AC | PRN

## 2023-05-09 NOTE — ED Triage Notes (Signed)
Pt states that he has nasal congestion, scratchy throat, watery eyes, sneezing headache. X2 days

## 2023-05-09 NOTE — Discharge Instructions (Signed)
Suspect that you have a viral illness that should run its course as we discussed.  Ensure adequate fluids and rest.  Follow-up if any symptoms persist or worsen.

## 2023-05-09 NOTE — ED Provider Notes (Addendum)
[facility]    CSN: [id number] Arrival date & time: 05/09/23  1125      History   Chief Complaint Chief Complaint  Patient presents with   Nasal Congestion    Nasal congestion, scratchy throat, watery eyes and sneezing.    HPI Tristan Thomas is a 52 y.o. male.   Patient presents with nasal congestion, scratchy throat, watery eyes, sneezing, cough, headache that started 2 days ago.  His girlfriend has had similar symptoms.  He has taken Mucinex for symptoms.  Reports history of seasonal asthma.  He does have an albuterol inhaler that he takes as needed but reports that he has not needed it since the symptoms started.  Denies chest pain or shortness of breath.  Denies any fever but does report that he has felt feverish with chills and bodyaches.     Past Medical History:  Diagnosis Date   ADHD (attention deficit hyperactivity disorder)    Anxiety    Arthritis    Asthma    not diagnosed   Cataract    Mixed OU   GERD (gastroesophageal reflux disease)    Hypertensive retinopathy    OU    Patient Active Problem List   Diagnosis Date Noted   Bimalleolar ankle fracture, right, closed, initial encounter 04/13/2017   HYPERLIPIDEMIA-MIXED 08/22/2009   CHEST PAIN UNSPECIFIED 08/22/2009    Past Surgical History:  Procedure Laterality Date   ORIF ANKLE FRACTURE Right 04/15/2017   Procedure: OPEN REDUCTION INTERNAL FIXATION (ORIF) RIGHT ANKLE AND SYNDESMOSIS;  Surgeon: Nadara Mustard, MD;  Location: [facility] OR;  Service: Orthopedics;  Laterality: Right;   TOOTH EXTRACTION     9 removed       Home Medications    Prior to Admission medications   Medication Sig Start Date End Date Taking? Authorizing Provider  benzonatate (TESSALON) 100 MG capsule Take 1 capsule (100 mg total) by mouth every 8 (eight) hours as needed for cough. 05/09/23  Yes Scout Gumbs, Gans E, FNP  Red Yeast Rice 600 MG TABS 1 tablet 11/02/15  Yes [provider]  tamsulosin (FLOMAX) 0.4  MG CAPS capsule tamsulosin 0.4 mg capsule   Yes [provider]  buPROPion (WELLBUTRIN) 75 MG tablet Take 75 mg by mouth 2 (two) times daily.    [provider]  busPIRone (BUSPAR) 10 MG tablet 1 tablet    [provider]  busPIRone (BUSPAR) 5 MG tablet Take 5 mg by mouth 3 (three) times daily. 10/07/19   [provider]  Cetirizine HCl 10 MG CAPS Take 1 capsule (10 mg total) by mouth daily for 10 days. 07/17/18 07/27/18  Wieters, Hallie C, PA-C  cyclobenzaprine (FLEXERIL) 5 MG tablet 1-2 tablets at bedtime as needed Patient not taking: Reported on 01/03/2022 04/19/21   [provider]  esomeprazole (NEXIUM) 40 MG capsule 1 capsule Patient not taking: Reported on 01/03/2022 05/04/15   [provider]  fluticasone (FLONASE) 50 MCG/ACT nasal spray Place 1 spray into both nostrils daily as needed for allergies or rhinitis.    [provider]  fluticasone (FLONASE) 50 MCG/ACT nasal spray 2 sprays in each nostril    [provider]  ibuprofen (ADVIL,MOTRIN) 600 MG tablet Take 1 tablet (600 mg total) by mouth every 6 (six) hours as needed. 07/17/18   Wieters, Hallie C, PA-C  loratadine (CLARITIN) 10 MG tablet Take 10 mg by mouth daily. Patient not taking: Reported on 01/09/2023 09/08/19   [provider]  loratadine (CLARITIN)  10 MG tablet 1 tablet Patient not taking: Reported on 06/28/2021 07/29/13   [provider]  omeprazole (PRILOSEC) 20 MG capsule Take 20 mg by mouth 3 times/day as needed-between meals & bedtime.  Patient not taking: Reported on 01/03/2022    [provider]  oxyCODONE-acetaminophen (PERCOCET) 5-325 MG tablet Take 1 tablet by mouth every 4 (four) hours as needed. Patient not taking: Reported on 01/03/2022 04/13/17   Adonis Huguenin, NP  oxyCODONE-acetaminophen (ROXICET) 5-325 MG tablet Take 1 tablet by mouth every 4 (four) hours as needed for severe pain. Patient not taking: Reported on 01/03/2022  04/15/17   Nadara Mustard, MD  PARoxetine (PAXIL) 10 MG tablet Take 10 mg by mouth daily. Patient not taking: Reported on 01/03/2022 08/24/20   [provider]  predniSONE (DELTASONE) 20 MG tablet Take 20 mg by mouth daily. Patient not taking: Reported on 01/03/2022 07/07/20   [provider]  promethazine-phenylephrine (PROMETHAZINE VC) 6.25-5 MG/5ML SYRP 5 ml as needed Patient not taking: Reported on 01/03/2022 05/26/18   [provider]  SUMAtriptan (IMITREX) 100 MG tablet Take 100 mg by mouth every 2 (two) hours as needed for migraine. May repeat in 2 hours if headache persists or recurs. Patient not taking: Reported on 01/03/2022    [provider]  SUMAtriptan (IMITREX) 100 MG tablet 1 tablet as needed Patient not taking: Reported on 01/03/2022 10/13/16   [provider]  traMADol Janean Sark) 50 MG tablet 1-2 tablets as needed 04/08/19   [provider]  venlafaxine XR (EFFEXOR-XR) 150 MG 24 hr capsule Take 150 mg by mouth daily. 09/08/19   [provider]    Family History Family History  Problem Relation Age of Onset   Heart disease Father    Stroke Mother    Hypertension Mother    Hyperlipidemia Mother     Social History Social History   Tobacco Use   Smoking status: Former    Types: Cigarettes   Smokeless tobacco: Never   Tobacco comments:    quit 1998 ish  Vaping Use   Vaping status: Never Used  Substance Use Topics   Alcohol use: Not Currently    Comment: monthy maybe   Drug use: No     Allergies   Hydrocodone-acetaminophen   Review of Systems Review of Systems Per HPI  Physical Exam Triage Vital Signs ED Triage Vitals  Encounter Vitals Group     BP 05/09/23 1326 (!) 175/109     Systolic BP Percentile --      Diastolic BP Percentile --      Pulse Rate 05/09/23 1326 90     Resp 05/09/23 1326 16     Temp 05/09/23 1326 98.3 F (36.8 C)     Temp Source 05/09/23 1326 Oral     SpO2 05/09/23 1326 97 %      Weight 05/09/23 1322 185 lb (83.9 kg)     Height 05/09/23 1322 5\' 11"  (1.803 m)     Head Circumference --      Peak Flow --      Pain Score 05/09/23 1322 8     Pain Loc --      Pain Education --      Exclude from Growth Chart --    No data found.  Updated Vital Signs BP (!) 147/97 (BP Location: Right Arm)   Pulse 90   Temp 98.3 F (36.8 C) (Oral)   Resp 16   Ht 5\' 11"  (1.803 m)  Wt 185 lb (83.9 kg)   SpO2 97%   BMI 25.80 kg/m   Visual Acuity Right Eye Distance:   Left Eye Distance:   Bilateral Distance:    Right Eye Near:   Left Eye Near:    Bilateral Near:     Physical Exam Constitutional:      General: He is not in acute distress.    Appearance: Normal appearance. He is not toxic-appearing or diaphoretic.  HENT:     Head: Normocephalic and atraumatic.     Right Ear: Tympanic membrane and ear canal normal.     Left Ear: Tympanic membrane and ear canal normal.     Nose: Congestion present.     Mouth/Throat:     Mouth: Mucous membranes are moist.     Pharynx: No posterior oropharyngeal erythema.  Eyes:     Extraocular Movements: Extraocular movements intact.     Conjunctiva/sclera: Conjunctivae normal.     Pupils: Pupils are equal, round, and reactive to light.  Cardiovascular:     Rate and Rhythm: Normal rate and regular rhythm.     Pulses: Normal pulses.     Heart sounds: Normal heart sounds.  Pulmonary:     Effort: Pulmonary effort is normal. No respiratory distress.     Breath sounds: Normal breath sounds. No wheezing.  Abdominal:     General: Abdomen is flat. Bowel sounds are normal.     Palpations: Abdomen is soft.  Musculoskeletal:        General: Normal range of motion.     Cervical back: Normal range of motion.  Skin:    General: Skin is warm and dry.  Neurological:     General: No focal deficit present.     Mental Status: He is alert and oriented to person, place, and time. Mental status is at baseline.  Psychiatric:        Mood and  Affect: Mood normal.        Behavior: Behavior normal.      UC Treatments / Results  Labs (all labs ordered are listed, but only abnormal results are displayed) Labs Reviewed - No data to display  EKG   Radiology No results found.  Procedures Procedures (including critical care time)  Medications Ordered in UC Medications - No data to display  Initial Impression / Assessment and Plan / UC Course  I have reviewed the triage vital signs and the nursing notes.  Pertinent labs & imaging results that were available during my care of the patient were reviewed by me and considered in my medical decision making (see chart for details).     Patient presents with symptoms likely from a viral upper respiratory infection.  Do not suspect underlying cardiopulmonary process. Symptoms seem unlikely related to ACS, CHF or COPD exacerbations, pneumonia, pneumothorax. Patient is nontoxic appearing and not in need of emergent medical intervention.  COVID testing deferred given negative COVID test at home.  Recommended symptom control with medications, supportive care, symptom management.  Blood pressure recheck was improved.  Patient reports he has been told his blood pressure is high in the past.  Encouraged him to monitor at home.  Return if symptoms fail to improve in 1-2 weeks or you develop shortness of breath, chest pain, severe headache. Patient states understanding and is agreeable.  Discharged with PCP followup.  Final Clinical Impressions(s) / UC Diagnoses   Final diagnoses:  Viral upper respiratory tract infection with cough     Discharge Instructions  Suspect that you have a viral illness that should run its course as we discussed.  Ensure adequate fluids and rest.  Follow-up if any symptoms persist or worsen.    ED Prescriptions     Medication Sig Dispense Auth. Provider   benzonatate (TESSALON) 100 MG capsule Take 1 capsule (100 mg total) by mouth every 8  (eight) hours as needed for cough. 21 capsule [place], Acie Fredrickson, [place]      PDMP not reviewed this encounter.   Gustavus Bryant, [place] 05/09/23 1409    Gustavus Bryant, [place] 05/09/23 1410

## 2023-06-02 DIAGNOSIS — R070 Pain in throat: Secondary | ICD-10-CM | POA: Diagnosis not present

## 2023-06-05 DIAGNOSIS — Z87891 Personal history of nicotine dependence: Secondary | ICD-10-CM | POA: Diagnosis not present

## 2023-06-05 DIAGNOSIS — J988 Other specified respiratory disorders: Secondary | ICD-10-CM | POA: Diagnosis not present

## 2023-06-10 DIAGNOSIS — R059 Cough, unspecified: Secondary | ICD-10-CM | POA: Diagnosis not present

## 2023-06-12 DIAGNOSIS — R7309 Other abnormal glucose: Secondary | ICD-10-CM | POA: Diagnosis not present

## 2023-06-12 DIAGNOSIS — Z79899 Other long term (current) drug therapy: Secondary | ICD-10-CM | POA: Diagnosis not present

## 2023-06-12 DIAGNOSIS — E785 Hyperlipidemia, unspecified: Secondary | ICD-10-CM | POA: Diagnosis not present

## 2023-06-12 DIAGNOSIS — N400 Enlarged prostate without lower urinary tract symptoms: Secondary | ICD-10-CM | POA: Diagnosis not present

## 2023-06-12 DIAGNOSIS — R946 Abnormal results of thyroid function studies: Secondary | ICD-10-CM | POA: Diagnosis not present

## 2023-06-12 DIAGNOSIS — Z125 Encounter for screening for malignant neoplasm of prostate: Secondary | ICD-10-CM | POA: Diagnosis not present

## 2023-06-19 DIAGNOSIS — R946 Abnormal results of thyroid function studies: Secondary | ICD-10-CM | POA: Diagnosis not present

## 2023-06-19 DIAGNOSIS — Z0000 Encounter for general adult medical examination without abnormal findings: Secondary | ICD-10-CM | POA: Diagnosis not present

## 2023-06-19 DIAGNOSIS — R7309 Other abnormal glucose: Secondary | ICD-10-CM | POA: Diagnosis not present

## 2023-06-19 DIAGNOSIS — E785 Hyperlipidemia, unspecified: Secondary | ICD-10-CM | POA: Diagnosis not present

## 2024-01-12 NOTE — Progress Notes (Signed)
 [facility] - Clinic Note  01/15/2024     CHIEF COMPLAINT Patient presents for Retina Follow Up  HISTORY OF PRESENT ILLNESS: Tristan Thomas is a 53 y.o. male who presents to the clinic today for:   HPI     Retina Follow Up   Patient presents with  Other.  In right eye.  This started 1 year ago.  I, the attending physician,  performed the HPI with the patient and updated documentation appropriately.        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 Patient stats vision doing fine. No eye pain.       Last edited by Valdemar Rogue, MD on 01/17/2024  2:48 AM.    Pt states vision is stable, no visual changes. Pt is moving towards [place] [place] and will require a referral to [facility].   Referring physician: Gerome Brunet, DO [address] [place],  [place] [address]  HISTORICAL INFORMATION:   Selected notes from the MEDICAL RECORD NUMBER Referred by Dr. Jordan DeMarco for concern of worsening PED   CURRENT MEDICATIONS: No current outpatient medications on file. (Ophthalmic Drugs)   No current facility-administered medications for this visit. (Ophthalmic Drugs)   Current Outpatient Medications (Other)  Medication Sig   benzonatate  (TESSALON ) 100 MG capsule Take 1 capsule (100 mg total) by mouth every 8 (eight) hours as needed for cough.   buPROPion (WELLBUTRIN) 75 MG tablet Take 75 mg by mouth 2 (two) times daily.   busPIRone (BUSPAR) 10 MG tablet 1 tablet   busPIRone (BUSPAR) 5 MG tablet Take 5 mg by mouth 3 (three) times daily.   fluticasone (FLONASE) 50 MCG/ACT nasal spray Place 1 spray into both nostrils daily as needed for allergies or rhinitis.   fluticasone (FLONASE) 50 MCG/ACT nasal spray 2 sprays in each nostril   ibuprofen  (ADVIL ,MOTRIN ) 600 MG tablet Take 1 tablet (600 mg total) by mouth every 6 (six) hours as needed.   Red Yeast Rice 600 MG TABS 1 tablet   tamsulosin (FLOMAX) 0.4 MG CAPS  capsule tamsulosin 0.4 mg capsule   traMADol (ULTRAM) 50 MG tablet 1-2 tablets as needed   venlafaxine XR (EFFEXOR-XR) 150 MG 24 hr capsule Take 150 mg by mouth daily.   Cetirizine  HCl 10 MG CAPS Take 1 capsule (10 mg total) by mouth daily for 10 days.   cyclobenzaprine (FLEXERIL) 5 MG tablet 1-2 tablets at bedtime as needed (Patient not taking: Reported on 01/15/2024)   esomeprazole (NEXIUM) 40 MG capsule 1 capsule (Patient not taking: Reported on 01/15/2024)   loratadine (CLARITIN) 10 MG tablet Take 10 mg by mouth daily. (Patient not taking: Reported on 01/15/2024)   loratadine (CLARITIN) 10 MG tablet 1 tablet (Patient not taking: Reported on 01/15/2024)   omeprazole (PRILOSEC) 20 MG capsule Take 20 mg by mouth 3 times/day as needed-between meals & bedtime.  (Patient not taking: Reported on 01/15/2024)   oxyCODONE -acetaminophen  (PERCOCET) 5-325 MG tablet Take 1 tablet by mouth every 4 (four) hours as needed. (Patient not taking: Reported on 01/15/2024)   oxyCODONE -acetaminophen  (ROXICET) 5-325 MG tablet Take 1 tablet by mouth every 4 (four) hours as needed for severe pain. (Patient not taking: Reported on 01/15/2024)   PARoxetine (PAXIL) 10 MG tablet Take 10 mg by mouth daily. (Patient not taking: Reported on 01/15/2024)   predniSONE  (DELTASONE ) 20 MG tablet Take 20 mg by mouth daily. (Patient not taking: Reported on 01/15/2024)  promethazine -phenylephrine (PROMETHAZINE  VC) 6.25-5 MG/5ML SYRP 5 ml as needed (Patient not taking: Reported on 01/15/2024)   SUMAtriptan (IMITREX) 100 MG tablet Take 100 mg by mouth every 2 (two) hours as needed for migraine. May repeat in 2 hours if headache persists or recurs. (Patient not taking: Reported on 01/15/2024)   SUMAtriptan (IMITREX) 100 MG tablet 1 tablet as needed (Patient not taking: Reported on 01/15/2024)   No current facility-administered medications for this visit. (Other)   REVIEW OF SYSTEMS: ROS   Positive for: Gastrointestinal, Neurological,  Musculoskeletal, Eyes, Respiratory, Psychiatric Negative for: Constitutional, Skin, Genitourinary, HENT, Endocrine, Cardiovascular, Allergic/Imm, Heme/Lymph Last edited by Orval Asberry RAMAN, COA on 01/15/2024  8:12 AM.       ALLERGIES Allergies  Allergen Reactions   Hydrocodone-Acetaminophen  Other (See Comments)    Pt got cold-chills     PAST MEDICAL HISTORY Past Medical History:  Diagnosis Date   ADHD (attention deficit hyperactivity disorder)    Anxiety    Arthritis    Asthma    not diagnosed   Cataract    Mixed OU   GERD (gastroesophageal reflux disease)    Hypertensive retinopathy    OU   Past Surgical History:  Procedure Laterality Date   ORIF ANKLE FRACTURE Right 04/15/2017   Procedure: OPEN REDUCTION INTERNAL FIXATION (ORIF) RIGHT ANKLE AND SYNDESMOSIS;  Surgeon: Harden Jerona GAILS, MD;  Location: [facility] OR;  Service: Orthopedics;  Laterality: Right;   TOOTH EXTRACTION     9 removed   FAMILY HISTORY Family History  Problem Relation Age of Onset   Heart disease Father    Stroke Mother    Hypertension Mother    Hyperlipidemia Mother    SOCIAL HISTORY Social History   Tobacco Use   Smoking status: Former    Types: Cigarettes   Smokeless tobacco: Never   Tobacco comments:    quit 1998 ish  Vaping Use   Vaping status: Never Used  Substance Use Topics   Alcohol use: Not Currently    Comment: monthy maybe   Drug use: No       OPHTHALMIC EXAM: Base Eye Exam     Visual Acuity (Snellen - Linear)       Right Left   Dist cc 20/20 20/20    Correction: Glasses         Tonometry (Tonopen, 8:10 AM)       Right Left   Pressure 15 16         Pupils       Dark Light Shape React APD   Right 3 2 Round Minimal None   Left 3 2 Round Minimal None         Visual Fields (Counting fingers)       Left Right    Full Full         Extraocular Movement       Right Left    Full, Ortho Full, Ortho         Neuro/Psych     Oriented x3: Yes    Mood/Affect: Normal         Dilation     Both eyes: 1.0% Mydriacyl, 2.5% Phenylephrine @ 8:10 AM           Slit Lamp and Fundus Exam     Slit Lamp Exam       Right Left   Lids/Lashes Dermatochalasis - upper lid, Meibomian gland dysfunction Dermatochalasis - upper lid, Meibomian gland dysfunction   Conjunctiva/Sclera White and quiet White  and quiet   Cornea Clear Clear   Anterior Chamber moderate depth, narrow angles moderate depth, narrow angles   Iris round and dilated, mild anterior bowing round and dilated, mild anterior bowing   Lens 1-2 + Nucleus sclerois, 2+ cortical 1-2 + Nucleus sclerois, 2+ cortical   Anterior Vitreous mild syneresis mild syneresis         Fundus Exam       Right Left   Disc compact, pink and sharp, mild PPA compact, pink and sharp   C/D Ratio 0.1 0.2   Macula flat, good foveal reflex, focal CNVM/PED with focal pigment clumping centrally and stable resolution of focal surrounding SRF and IRF -- nasal to fovea, focal druse IT mac, No heme or edema flat, good foveal reflex, mild RPE mottling, no heme or edema   Vessels mild attenuation, mild tortuosity, mild copper wiring mild attenuation, mild tortuosity, mild copper wiring, mild AV crossing changes   Periphery attached, no heme attached, no heme           Refraction     Wearing Rx       Sphere Cylinder Add   Right +2.75 Sphere +2.00   Left +2.25 Sphere +2.00           IMAGING AND PROCEDURES  Imaging and Procedures for @TODAY @  OCT, Retina - OU - Both Eyes       Right Eye Quality was good. Central Foveal Thickness: 276. Progression has been stable. Findings include normal foveal contour, no IRF, no SRF, intraretinal hyper-reflective material, choroidal neovascular membrane, pigment epithelial detachment, outer retinal atrophy, vitreomacular adhesion (Stable improvement in IRF overlying punctate, focal CNVM nasal to fovea, persistent IRHM).   Left Eye Quality was good. Central  Foveal Thickness: 286. Progression has been stable. Findings include normal foveal contour, no IRF, no SRF, vitreomacular adhesion .   Notes *Images captured and stored on drive  Diagnosis / Impression:  OD: focal idiopathic CNV -- Stable improvement in IRF/SRF overlying punctate, focal CNVM nasal to fovea OS: NFP, no IRF/SRF, VMA  Clinical management:  See below  Abbreviations: NFP - Normal foveal profile. CME - cystoid macular edema. PED - pigment epithelial detachment. IRF - intraretinal fluid. SRF - subretinal fluid. EZ - ellipsoid zone. ERM - epiretinal membrane. ORA - outer retinal atrophy. ORT - outer retinal tubulation. SRHM - subretinal hyper-reflective material             ASSESSMENT/PLAN:    ICD-10-CM   1. 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 Exudative age-related macular degeneration of right eye with active choroidal neovascularization (HCC)  H35.3211     3. Essential hypertension  I10     4. Hypertensive retinopathy of both eyes  H35.033     5. Combined forms of age-related cataract of both eyes  H25.813      1,2. Idiopathic CNV OD - stably improved  - s/p IVA OD #1 (04.16.21), #2 (05.14.21), #3 (06.16.21), #4 (07.23.21), #5 (08.20.21), #6 (09.24.21) -- IVA resistance             - S/P IVE OD #1 (10.25.21), #2 (11.22.21), #3 (12.21.21), #4 (02.04.22), #5 (03.18.22), #6 (05.13.22), #7 (07.22.22)  - good response to IVE OD  - FA (03.26.21) shows focal CNV with late leakage nasal to fovea corresponding to OCT  - OCT shows Stable improvement in IRF/SRF overlying punctate, focal CNVM nasal to fovea, persistent IRHM  -  BCVA 20/20 OD   - pt reports stable improvement in metamorphopsia  - Recommend holding IVE today  - pt in agreement  - f/u in 1 year -- DFE, OCT, possible injection  3,4. Hypertensive retinopathy OU  - discussed importance of tight BP control  - monitor    5.  Mixed cataracts OU  - The  symptoms of cataract, surgical options, and treatments and risks were discussed with patient.  - discussed diagnosis and progression  - not yet visually significant  - monitor   Ophthalmic Meds Ordered this visit:  No orders of the defined types were placed in this encounter.    Return in about 1 year (around 01/14/2025) for idiopathic CNV OD, DFE, OCT.  There are no Patient Instructions on file for this visit.  This document serves as a record of services personally performed by Redell JUDITHANN Hans, MD, PhD. It was created on their behalf by Delon Newness COT, an ophthalmic technician. The creation of this record is the provider's dictation and/or activities during the visit.    Electronically signed by: Delon Newness COT 07.22.2025 2:49 AM  This document serves as a record of services personally performed by Redell JUDITHANN Hans, MD, PhD. It was created on their behalf by Almetta Pesa, an ophthalmic technician. The creation of this record is the provider's dictation and/or activities during the visit.    Electronically signed by: Almetta Pesa, OA, 01/17/24  2:49 AM  Redell JUDITHANN Hans, M.D., Ph.D. Diseases & Surgery of the Retina and Vitreous [facility] 01/15/2024   I have reviewed the above documentation for accuracy and completeness, and I agree with the above. Redell JUDITHANN Hans, M.D., Ph.D. 01/17/24 2:50 AM   Abbreviations: M myopia (nearsighted); A astigmatism; H hyperopia (farsighted); P presbyopia; Mrx spectacle prescription;  CTL contact lenses; OD right eye; OS left eye; OU both eyes  XT exotropia; ET esotropia; PEK punctate epithelial keratitis; PEE punctate epithelial erosions; DES dry eye syndrome; MGD meibomian gland dysfunction; ATs artificial tears; PFAT's preservative free artificial tears; NSC nuclear sclerotic cataract; PSC posterior subcapsular cataract; ERM epi-retinal membrane; PVD posterior vitreous detachment; RD retinal detachment; DM  diabetes mellitus; DR diabetic retinopathy; NPDR non-proliferative diabetic retinopathy; PDR proliferative diabetic retinopathy; CSME clinically significant macular edema; DME diabetic macular edema; dbh dot blot hemorrhages; CWS cotton wool spot; POAG primary open angle glaucoma; C/D cup-to-disc ratio; HVF humphrey visual field; GVF goldmann visual field; OCT optical coherence tomography; IOP intraocular pressure; BRVO Branch retinal vein occlusion; CRVO central retinal vein occlusion; CRAO central retinal artery occlusion; BRAO branch retinal artery occlusion; RT retinal tear; SB scleral buckle; PPV pars plana vitrectomy; VH Vitreous hemorrhage; PRP panretinal laser photocoagulation; IVK intravitreal kenalog; VMT vitreomacular traction; MH Macular hole;  NVD neovascularization of the disc; NVE neovascularization elsewhere; AREDS age related eye disease study; ARMD age related macular degeneration; POAG primary open angle glaucoma; EBMD epithelial/anterior basement membrane dystrophy; ACIOL anterior chamber intraocular lens; IOL intraocular lens; PCIOL posterior chamber intraocular lens; Phaco/IOL phacoemulsification with intraocular lens placement; PRK photorefractive keratectomy; LASIK laser assisted in situ keratomileusis; HTN hypertension; DM diabetes mellitus; COPD chronic obstructive pulmonary disease

## 2024-01-15 ENCOUNTER — Encounter (INDEPENDENT_AMBULATORY_CARE_PROVIDER_SITE_OTHER): Payer: Self-pay | Admitting: Ophthalmology

## 2024-01-15 ENCOUNTER — Ambulatory Visit (INDEPENDENT_AMBULATORY_CARE_PROVIDER_SITE_OTHER): Payer: BC Managed Care – PPO | Admitting: Ophthalmology

## 2024-01-15 DIAGNOSIS — H353211 Exudative age-related macular degeneration, right eye, with active choroidal neovascularization: Secondary | ICD-10-CM

## 2024-01-15 DIAGNOSIS — H35051 Retinal neovascularization, unspecified, right eye: Secondary | ICD-10-CM | POA: Diagnosis not present

## 2024-01-15 DIAGNOSIS — I10 Essential (primary) hypertension: Secondary | ICD-10-CM

## 2024-01-15 DIAGNOSIS — H25813 Combined forms of age-related cataract, bilateral: Secondary | ICD-10-CM

## 2024-01-15 DIAGNOSIS — H35033 Hypertensive retinopathy, bilateral: Secondary | ICD-10-CM | POA: Diagnosis not present

## 2024-01-17 ENCOUNTER — Encounter (INDEPENDENT_AMBULATORY_CARE_PROVIDER_SITE_OTHER): Payer: Self-pay | Admitting: Ophthalmology

## 2024-04-15 DIAGNOSIS — H35363 Drusen (degenerative) of macula, bilateral: Secondary | ICD-10-CM | POA: Diagnosis not present

## 2024-04-15 DIAGNOSIS — H35721 Serous detachment of retinal pigment epithelium, right eye: Secondary | ICD-10-CM | POA: Diagnosis not present

## 2024-04-15 DIAGNOSIS — H35051 Retinal neovascularization, unspecified, right eye: Secondary | ICD-10-CM | POA: Diagnosis not present

## 2024-04-15 DIAGNOSIS — H25813 Combined forms of age-related cataract, bilateral: Secondary | ICD-10-CM | POA: Diagnosis not present

## 2024-04-26 DIAGNOSIS — M791 Myalgia, unspecified site: Secondary | ICD-10-CM | POA: Diagnosis not present

## 2024-04-26 DIAGNOSIS — F419 Anxiety disorder, unspecified: Secondary | ICD-10-CM | POA: Diagnosis not present

## 2025-01-20 ENCOUNTER — Encounter (INDEPENDENT_AMBULATORY_CARE_PROVIDER_SITE_OTHER): Admitting: Ophthalmology
